# Patient Record
Sex: Male | Born: 1950 | Race: Black or African American | Hispanic: No | Marital: Single | Smoking: Current every day smoker
Health system: Southern US, Community
[De-identification: ages and names within clinical notes are randomized; demographics above are authoritative.]

## PROBLEM LIST (undated history)

## (undated) DIAGNOSIS — G510 Bell's palsy: Secondary | ICD-10-CM

## (undated) DIAGNOSIS — I10 Essential (primary) hypertension: Secondary | ICD-10-CM

---

## 2017-09-27 ENCOUNTER — Emergency Department: Payer: No Typology Code available for payment source

## 2017-09-27 ENCOUNTER — Other Ambulatory Visit: Payer: Self-pay

## 2017-09-27 ENCOUNTER — Inpatient Hospital Stay
Admission: EM | Admit: 2017-09-27 | Discharge: 2017-10-04 | DRG: 871 | Disposition: E | Payer: No Typology Code available for payment source | Attending: Internal Medicine | Admitting: Internal Medicine

## 2017-09-27 DIAGNOSIS — I341 Nonrheumatic mitral (valve) prolapse: Secondary | ICD-10-CM | POA: Diagnosis present

## 2017-09-27 DIAGNOSIS — J189 Pneumonia, unspecified organism: Secondary | ICD-10-CM | POA: Diagnosis not present

## 2017-09-27 DIAGNOSIS — Z7189 Other specified counseling: Secondary | ICD-10-CM | POA: Diagnosis not present

## 2017-09-27 DIAGNOSIS — R29810 Facial weakness: Secondary | ICD-10-CM | POA: Diagnosis present

## 2017-09-27 DIAGNOSIS — J690 Pneumonitis due to inhalation of food and vomit: Secondary | ICD-10-CM | POA: Diagnosis present

## 2017-09-27 DIAGNOSIS — R0902 Hypoxemia: Secondary | ICD-10-CM

## 2017-09-27 DIAGNOSIS — A419 Sepsis, unspecified organism: Secondary | ICD-10-CM | POA: Diagnosis present

## 2017-09-27 DIAGNOSIS — J9690 Respiratory failure, unspecified, unspecified whether with hypoxia or hypercapnia: Secondary | ICD-10-CM

## 2017-09-27 DIAGNOSIS — E43 Unspecified severe protein-calorie malnutrition: Secondary | ICD-10-CM

## 2017-09-27 DIAGNOSIS — R000 Tachycardia, unspecified: Secondary | ICD-10-CM | POA: Diagnosis present

## 2017-09-27 DIAGNOSIS — R059 Cough, unspecified: Secondary | ICD-10-CM

## 2017-09-27 DIAGNOSIS — J9622 Acute and chronic respiratory failure with hypercapnia: Secondary | ICD-10-CM | POA: Diagnosis present

## 2017-09-27 DIAGNOSIS — Z79899 Other long term (current) drug therapy: Secondary | ICD-10-CM

## 2017-09-27 DIAGNOSIS — E860 Dehydration: Secondary | ICD-10-CM | POA: Diagnosis present

## 2017-09-27 DIAGNOSIS — Z66 Do not resuscitate: Secondary | ICD-10-CM | POA: Diagnosis present

## 2017-09-27 DIAGNOSIS — F17210 Nicotine dependence, cigarettes, uncomplicated: Secondary | ICD-10-CM | POA: Diagnosis present

## 2017-09-27 DIAGNOSIS — I248 Other forms of acute ischemic heart disease: Secondary | ICD-10-CM | POA: Diagnosis present

## 2017-09-27 DIAGNOSIS — Z681 Body mass index (BMI) 19 or less, adult: Secondary | ICD-10-CM

## 2017-09-27 DIAGNOSIS — J439 Emphysema, unspecified: Secondary | ICD-10-CM | POA: Diagnosis present

## 2017-09-27 DIAGNOSIS — R0602 Shortness of breath: Secondary | ICD-10-CM | POA: Diagnosis present

## 2017-09-27 DIAGNOSIS — I2510 Atherosclerotic heart disease of native coronary artery without angina pectoris: Secondary | ICD-10-CM | POA: Diagnosis present

## 2017-09-27 DIAGNOSIS — I2723 Pulmonary hypertension due to lung diseases and hypoxia: Secondary | ICD-10-CM | POA: Diagnosis present

## 2017-09-27 DIAGNOSIS — I10 Essential (primary) hypertension: Secondary | ICD-10-CM | POA: Diagnosis present

## 2017-09-27 DIAGNOSIS — J850 Gangrene and necrosis of lung: Secondary | ICD-10-CM | POA: Diagnosis present

## 2017-09-27 DIAGNOSIS — I493 Ventricular premature depolarization: Secondary | ICD-10-CM | POA: Diagnosis present

## 2017-09-27 DIAGNOSIS — J9621 Acute and chronic respiratory failure with hypoxia: Secondary | ICD-10-CM | POA: Diagnosis present

## 2017-09-27 DIAGNOSIS — Z23 Encounter for immunization: Secondary | ICD-10-CM

## 2017-09-27 DIAGNOSIS — Z515 Encounter for palliative care: Secondary | ICD-10-CM

## 2017-09-27 DIAGNOSIS — Z886 Allergy status to analgesic agent status: Secondary | ICD-10-CM

## 2017-09-27 DIAGNOSIS — J9602 Acute respiratory failure with hypercapnia: Secondary | ICD-10-CM | POA: Diagnosis not present

## 2017-09-27 DIAGNOSIS — F1020 Alcohol dependence, uncomplicated: Secondary | ICD-10-CM | POA: Diagnosis present

## 2017-09-27 DIAGNOSIS — R1312 Dysphagia, oropharyngeal phase: Secondary | ICD-10-CM | POA: Diagnosis present

## 2017-09-27 DIAGNOSIS — D649 Anemia, unspecified: Secondary | ICD-10-CM | POA: Diagnosis present

## 2017-09-27 DIAGNOSIS — E876 Hypokalemia: Secondary | ICD-10-CM | POA: Diagnosis present

## 2017-09-27 DIAGNOSIS — R05 Cough: Secondary | ICD-10-CM

## 2017-09-27 DIAGNOSIS — J9601 Acute respiratory failure with hypoxia: Secondary | ICD-10-CM | POA: Diagnosis not present

## 2017-09-27 HISTORY — DX: Essential (primary) hypertension: I10

## 2017-09-27 HISTORY — DX: Bell's palsy: G51.0

## 2017-09-27 LAB — CBC WITH DIFFERENTIAL/PLATELET
BASOS PCT: 0 %
Basophils Absolute: 0 10*3/uL (ref 0–0.1)
Eosinophils Absolute: 0 10*3/uL (ref 0–0.7)
Eosinophils Relative: 0 %
HEMATOCRIT: 35.3 % — AB (ref 40.0–52.0)
Hemoglobin: 11.6 g/dL — ABNORMAL LOW (ref 13.0–18.0)
LYMPHS PCT: 5 %
Lymphs Abs: 1.1 10*3/uL (ref 1.0–3.6)
MCH: 29.9 pg (ref 26.0–34.0)
MCHC: 32.9 g/dL (ref 32.0–36.0)
MCV: 90.7 fL (ref 80.0–100.0)
Monocytes Absolute: 0.2 10*3/uL (ref 0.2–1.0)
Monocytes Relative: 1 %
NEUTROS PCT: 94 %
Neutro Abs: 20.8 10*3/uL — ABNORMAL HIGH (ref 1.4–6.5)
PLATELETS: 380 10*3/uL (ref 150–440)
RBC: 3.89 MIL/uL — ABNORMAL LOW (ref 4.40–5.90)
RDW: 12.8 % (ref 11.5–14.5)
WBC: 22.1 10*3/uL — ABNORMAL HIGH (ref 3.8–10.6)

## 2017-09-27 LAB — COMPREHENSIVE METABOLIC PANEL
ALT: 19 U/L (ref 17–63)
AST: 41 U/L (ref 15–41)
Albumin: 1.6 g/dL — ABNORMAL LOW (ref 3.5–5.0)
Alkaline Phosphatase: 167 U/L — ABNORMAL HIGH (ref 38–126)
Anion gap: 19 — ABNORMAL HIGH (ref 5–15)
BILIRUBIN TOTAL: 0.6 mg/dL (ref 0.3–1.2)
BUN: 61 mg/dL — AB (ref 6–20)
CHLORIDE: 91 mmol/L — AB (ref 101–111)
CO2: 28 mmol/L (ref 22–32)
CREATININE: 1.22 mg/dL (ref 0.61–1.24)
Calcium: 8.1 mg/dL — ABNORMAL LOW (ref 8.9–10.3)
GFR calc Af Amer: >60 mL/min (ref >60)
Glucose, Bld: 111 mg/dL — ABNORMAL HIGH (ref 65–99)
Potassium: 2.8 mmol/L — ABNORMAL LOW (ref 3.5–5.1)
Sodium: 138 mmol/L (ref 135–145)
Total Protein: 6.4 g/dL — ABNORMAL LOW (ref 6.5–8.1)

## 2017-09-27 LAB — INFLUENZA PANEL BY PCR (TYPE A & B)
INFLAPCR: NEGATIVE
INFLBPCR: NEGATIVE

## 2017-09-27 LAB — URINALYSIS, COMPLETE (UACMP) WITH MICROSCOPIC
BACTERIA UA: NONE SEEN
BILIRUBIN URINE: NEGATIVE
Glucose, UA: NEGATIVE mg/dL
Hgb urine dipstick: NEGATIVE
Ketones, ur: NEGATIVE mg/dL
LEUKOCYTES UA: NEGATIVE
Nitrite: NEGATIVE
PROTEIN: NEGATIVE mg/dL
SPECIFIC GRAVITY, URINE: 1.028 (ref 1.005–1.030)
Squamous Epithelial / LPF: NONE SEEN
pH: 5 (ref 5.0–8.0)

## 2017-09-27 LAB — LACTIC ACID, PLASMA
Lactic Acid, Venous: 3.2 mmol/L (ref 0.5–1.9)
Lactic Acid, Venous: 3.7 mmol/L (ref 0.5–1.9)

## 2017-09-27 LAB — TROPONIN I
TROPONIN I: 0.07 ng/mL — AB (ref <0.03)
Troponin I: 1.32 ng/mL (ref <0.03)

## 2017-09-27 LAB — CBC
HEMATOCRIT: 30.6 % — AB (ref 40.0–52.0)
HEMOGLOBIN: 10 g/dL — AB (ref 13.0–18.0)
MCH: 29.7 pg (ref 26.0–34.0)
MCHC: 32.8 g/dL (ref 32.0–36.0)
MCV: 90.4 fL (ref 80.0–100.0)
Platelets: 350 10*3/uL (ref 150–440)
RBC: 3.38 MIL/uL — ABNORMAL LOW (ref 4.40–5.90)
RDW: 12.6 % (ref 11.5–14.5)
WBC: 18.5 10*3/uL — ABNORMAL HIGH (ref 3.8–10.6)

## 2017-09-27 LAB — TSH: TSH: 0.434 u[IU]/mL (ref 0.350–4.500)

## 2017-09-27 LAB — BRAIN NATRIURETIC PEPTIDE: B Natriuretic Peptide: 176 pg/mL — ABNORMAL HIGH (ref 0.0–100.0)

## 2017-09-27 LAB — FIBRIN DERIVATIVES D-DIMER (ARMC ONLY): Fibrin derivatives D-dimer (ARMC): 1451.29 ng/mL (FEU) — ABNORMAL HIGH (ref 0.00–499.00)

## 2017-09-27 MED ORDER — SODIUM CHLORIDE 0.9 % IV SOLN: 1.0000 g | INTRAVENOUS | Status: DC

## 2017-09-27 MED ORDER — SODIUM CHLORIDE 0.9 % IV SOLN
INTRAVENOUS | Status: AC
  Administered 2017-09-27 – 2017-09-28 (×2): via INTRAVENOUS

## 2017-09-27 MED ORDER — SODIUM CHLORIDE 0.9 % IV SOLN
3.0000 g | Freq: Four times a day (QID) | INTRAVENOUS | Status: DC
  Administered 2017-09-27 – 2017-10-01 (×13): 3 g via INTRAVENOUS
  Filled 2017-09-27 (×19): qty 3

## 2017-09-27 MED ORDER — SODIUM CHLORIDE 0.9 % IV SOLN: 500.0000 mg | INTRAVENOUS | Status: DC

## 2017-09-27 MED ORDER — AZITHROMYCIN 500 MG IV SOLR
500.0000 mg | Freq: Once | INTRAVENOUS | Status: AC
  Administered 2017-09-27: 500 mg via INTRAVENOUS
  Filled 2017-09-27: qty 500

## 2017-09-27 MED ORDER — INFLUENZA VAC SPLIT HIGH-DOSE 0.5 ML IM SUSY
0.5000 mL | PREFILLED_SYRINGE | INTRAMUSCULAR | Status: AC
  Administered 2017-09-28: 10:00:00 0.5 mL via INTRAMUSCULAR
  Filled 2017-09-27: qty 0.5

## 2017-09-27 MED ORDER — IOPAMIDOL (ISOVUE-300) INJECTION 61%
75.0000 mL | Freq: Once | INTRAVENOUS | Status: AC | PRN
  Administered 2017-09-27: 75 mL via INTRAVENOUS

## 2017-09-27 MED ORDER — SODIUM CHLORIDE 0.9 % IV BOLUS
1000.0000 mL | Freq: Once | INTRAVENOUS | Status: AC
  Administered 2017-09-27: 1000 mL via INTRAVENOUS

## 2017-09-27 MED ORDER — AMLODIPINE BESYLATE 10 MG PO TABS
10.0000 mg | ORAL_TABLET | Freq: Every day | ORAL | Status: DC
  Administered 2017-09-28: 10 mg via ORAL
  Filled 2017-09-27: qty 2

## 2017-09-27 MED ORDER — METOPROLOL SUCCINATE ER 50 MG PO TB24
25.0000 mg | ORAL_TABLET | Freq: Every day | ORAL | Status: DC
Start: 2017-09-28 — End: 2017-09-28
  Administered 2017-09-28: 25 mg via ORAL
  Filled 2017-09-27 (×2): qty 1

## 2017-09-27 MED ORDER — POTASSIUM CHLORIDE 10 MEQ/100ML IV SOLN
10.0000 meq | Freq: Once | INTRAVENOUS | Status: AC
  Administered 2017-09-27: 17:00:00 10 meq via INTRAVENOUS
  Filled 2017-09-27: qty 100

## 2017-09-27 MED ORDER — ENOXAPARIN SODIUM 40 MG/0.4ML ~~LOC~~ SOLN
40.0000 mg | SUBCUTANEOUS | Status: DC
  Administered 2017-09-27 – 2017-09-30 (×3): 40 mg via SUBCUTANEOUS
  Filled 2017-09-27 (×3): qty 0.4

## 2017-09-27 MED ORDER — ONDANSETRON HCL 4 MG/2ML IJ SOLN
4.0000 mg | Freq: Four times a day (QID) | INTRAMUSCULAR | Status: DC | PRN
  Administered 2017-09-29: 4 mg via INTRAVENOUS
  Filled 2017-09-27: qty 2

## 2017-09-27 MED ORDER — SODIUM CHLORIDE 0.9 % IV SOLN
1.0000 g | Freq: Once | INTRAVENOUS | Status: AC
  Administered 2017-09-27: 1 g via INTRAVENOUS
  Filled 2017-09-27: qty 10

## 2017-09-27 MED ORDER — ACETAMINOPHEN 325 MG PO TABS: 650.0000 mg | ORAL_TABLET | Freq: Four times a day (QID) | ORAL | Status: DC | PRN

## 2017-09-27 MED ORDER — TRAZODONE HCL 50 MG PO TABS
50.0000 mg | ORAL_TABLET | Freq: Every evening | ORAL | Status: DC | PRN
  Administered 2017-09-27: 50 mg via ORAL
  Filled 2017-09-27: qty 1

## 2017-09-27 MED ORDER — POTASSIUM CHLORIDE CRYS ER 20 MEQ PO TBCR
40.0000 meq | EXTENDED_RELEASE_TABLET | ORAL | Status: AC
  Administered 2017-09-27 (×2): 40 meq via ORAL
  Filled 2017-09-27 (×2): qty 2

## 2017-09-27 MED ORDER — PNEUMOCOCCAL VAC POLYVALENT 25 MCG/0.5ML IJ INJ
0.5000 mL | INJECTION | INTRAMUSCULAR | Status: AC
  Administered 2017-09-28: 10:00:00 0.5 mL via INTRAMUSCULAR
  Filled 2017-09-27: qty 0.5

## 2017-09-27 MED ORDER — LOSARTAN POTASSIUM 50 MG PO TABS
100.0000 mg | ORAL_TABLET | Freq: Every day | ORAL | Status: DC
  Administered 2017-09-28: 10:00:00 100 mg via ORAL
  Filled 2017-09-27: qty 2

## 2017-09-27 MED ORDER — NICOTINE 21 MG/24HR TD PT24
21.0000 mg | MEDICATED_PATCH | Freq: Every day | TRANSDERMAL | Status: DC
  Administered 2017-09-27 – 2017-09-30 (×4): 21 mg via TRANSDERMAL
  Filled 2017-09-27 (×4): qty 1

## 2017-09-27 NOTE — ED Notes (Signed)
Pt was taken to CT and when he was returned this nurse was not notified and they did not restart his atb or let the nurse know that it needed to be done - IV zithromax being transferred to the floor with pt

## 2017-09-27 NOTE — Progress Notes (Signed)
Pharmacy Antibiotic Note  Kyle Bates is a 67 y.o. male admitted on 10/01/2017 with pneumonia.  Pharmacy has been consulted for aspiration pna unasyn dosing.  Plan: unasyn 3gm iv q6h  Height: _0  (167.6 cm) Weight: 135 lb (61.2 kg) IBW/kg (Calculated) : 63.8  Temp (24hrs), Avg:97.6 F (36.4 C), Min:97.6 F (36.4 C), Max:97.6 F (36.4 C)  Recent Labs  Lab 09/24/2017 1152 09/03/2017 1410  WBC 22.1*  --   CREATININE 1.22  --   LATICACIDVEN  --  3.2*    Estimated Creatinine Clearance: 52.3 mL/min (by C-G formula based on SCr of 1.22 mg/dL).    Allergies  Allergen Reactions  . Aspirin     Upset stomach. Gi bleed    Antimicrobials this admission: Anti-infectives (From admission, onward)   Start     Dose/Rate Route Frequency Ordered Stop   09/28/17 1400  cefTRIAXone (ROCEPHIN) 1 g in sodium chloride 0.9 % 100 mL IVPB  Status:  Discontinued     1 g 200 mL/hr over 30 Minutes Intravenous Every 24 hours 09/24/2017 1534 09/18/2017 1612   09/24/2017 1630  Ampicillin-Sulbactam (UNASYN) 3 g in sodium chloride 0.9 % 100 mL IVPB     3 g 200 mL/hr over 30 Minutes Intravenous Every 6 hours 09/19/2017 1623     09/24/2017 1545  azithromycin (ZITHROMAX) 500 mg in sodium chloride 0.9 % 250 mL IVPB  Status:  Discontinued     500 mg 250 mL/hr over 60 Minutes Intravenous Every 24 hours 09/29/2017 1532 10/02/2017 1612   09/11/2017 1345  cefTRIAXone (ROCEPHIN) 1 g in sodium chloride 0.9 % 100 mL IVPB     1 g 200 mL/hr over 30 Minutes Intravenous  Once 10/02/2017 1335 09/13/2017 1523   09/19/2017 1345  azithromycin (ZITHROMAX) 500 mg in sodium chloride 0.9 % 250 mL IVPB     500 mg 250 mL/hr over 60 Minutes Intravenous  Once 09/19/2017 1335 09/19/2017 1552      Microbiology results: No results found for this or any previous visit (from the past 240 hour(s)).   Thank you for allowing pharmacy to be a part of this patient's care.  Donna Christen Zuriyah Shatz 09/28/2017 4:23 PM

## 2017-09-27 NOTE — ED Notes (Signed)
Dr Cherylann Banas notified of elevated troponin of 0.07 - no new orders at this time

## 2017-09-27 NOTE — H&P (Signed)
[facility] [place] NAME: Kyle Bates    MR#:  [id number]  DATE OF BIRTH:  1951-06-18  DATE OF ADMISSION:  09/14/2017  PRIMARY CARE PHYSICIAN: Patient, No Pcp Per   REQUESTING/REFERRING PHYSICIAN: Dr. Arta Silence  CHIEF COMPLAINT:   Chief Complaint  Patient presents with  . Shortness of Breath    HISTORY OF PRESENT ILLNESS:  Kyle Bates  is a 67 y.o. male with a known history of left facial Bell's palsy, hypertension, ongoing smoking and alcohol use presents to hospital secondary to worsening weakness, difficulty breathing going on for several weeks now. Patient is a very poor historian. He smokes about 1 pack of cigarettes every day and has been drinking at least 3-5 cans of beer every night. He continues to work but has been noticing that he has become very weak. His appetite is low and has lost several pounds of weight in the last couple of months. The shortness of breath has been worsening in the last week, also complains of some low-grade fevers and chills. Denies any chest pain, nausea or vomiting. Chest x-ray here reveals right middle lobe and lower lobe infiltrates.  PAST MEDICAL HISTORY:   Past Medical History:  Diagnosis Date  . Bell's palsy    left face  . Hypertension     PAST SURGICAL HISTORY:  History reviewed. No pertinent surgical history.  SOCIAL HISTORY:   Social History   Tobacco Use  . Smoking status: Current Every Day Smoker    Packs/day: 1.00    Types: Cigarettes  . Smokeless tobacco: Never Used  Substance Use Topics  . Alcohol use: Yes    Alcohol/week: 1.2 oz    Types: 2 Cans of beer per week    Comment: 3 cans per day    FAMILY HISTORY:   Family History  Problem Relation Age of Onset  . Dementia Mother     DRUG ALLERGIES:   Allergies  Allergen Reactions  . Aspirin     Upset stomach. Gi bleed    REVIEW OF SYSTEMS:   Review of Systems  Constitutional: Positive for  malaise/fatigue and weight loss. Negative for chills and fever.  HENT: Negative for ear discharge, ear pain, hearing loss and nosebleeds.   Eyes: Negative for blurred vision, double vision and photophobia.  Respiratory: Positive for cough and shortness of breath. Negative for hemoptysis and wheezing.   Cardiovascular: Positive for orthopnea. Negative for chest pain, palpitations and leg swelling.  Gastrointestinal: Negative for abdominal pain, constipation, diarrhea, melena, nausea and vomiting.  Genitourinary: Negative for dysuria, frequency and urgency.  Musculoskeletal: Positive for back pain. Negative for myalgias and neck pain.  Skin: Negative for rash.  Neurological: Negative for dizziness, tingling, sensory change, speech change, focal weakness and headaches.  Endo/Heme/Allergies: Does not bruise/bleed easily.  Psychiatric/Behavioral: Negative for depression.    MEDICATIONS AT HOME:   Prior to Admission medications   Medication Sig Start Date End Date Taking? Authorizing Provider  amLODipine (NORVASC) 10 MG tablet Take 10 mg by mouth daily.   Yes [provider]  losartan-hydrochlorothiazide (HYZAAR) 100-25 MG tablet Take 1 tablet by mouth daily.   Yes [provider]  metoprolol succinate (TOPROL-XL) 25 MG 24 hr tablet Take 25 mg by mouth daily.   Yes [provider]      VITAL SIGNS:  Blood pressure 131/69, pulse 100, temperature 97.6 F (36.4 C), temperature source Oral, resp. rate (!) 35, height _0  (1.676  m), weight 61.2 kg (135 lb), SpO2 95 %.  PHYSICAL EXAMINATION:   Physical Exam  GENERAL:  67 y.o.-year-old ill nourished patient lying in the bed with no acute distress.  EYES: Pupils equal, round, reactive to light and accommodation. No scleral icterus. Extraocular muscles intact.  HEENT: Left facial droop due to bells palsy. Head atraumatic, normocephalic. Oropharynx and nasopharynx clear.  NECK:  Supple, no jugular venous distention. No  thyroid enlargement, no tenderness.  LUNGS: Normal breath sounds bilaterally, no wheezing, rales,rhonchi or crepitation. No use of accessory muscles of respiration. Decreased right basilar breath sounds CARDIOVASCULAR: S1, S2 normal. No murmurs, rubs, or gallops.  ABDOMEN: Soft, nontender, nondistended. Bowel sounds present. No organomegaly or mass.  EXTREMITIES: No pedal edema, cyanosis, or clubbing.  NEUROLOGIC: Cranial nerves II through XII are intact. Muscle strength 5/5 in all extremities. Sensation intact. Gait not checked. Global weakness noted. PSYCHIATRIC: The patient is alert and oriented x 3.  SKIN: No obvious rash, lesion, or ulcer.   LABORATORY PANEL:   CBC Recent Labs  Lab 09/04/2017 1152  WBC 22.1*  HGB 11.6*  HCT 35.3*  PLT 380   ------------------------------------------------------------------------------------------------------------------  Chemistries  Recent Labs  Lab 09/18/2017 1152  NA 138  K 2.8*  CL 91*  CO2 28  GLUCOSE 111*  BUN 61*  CREATININE 1.22  CALCIUM 8.1*  AST 41  ALT 19  ALKPHOS 167*  BILITOT 0.6   ------------------------------------------------------------------------------------------------------------------  Cardiac Enzymes Recent Labs  Lab 09/26/2017 1152  TROPONINI 0.07*   ------------------------------------------------------------------------------------------------------------------  RADIOLOGY:  Dg Chest 2 View  Result Date: 09/12/2017 CLINICAL DATA:  Shortness of breath and hypoxia EXAM: CHEST - 2 VIEW COMPARISON:  None. FINDINGS: Cardiac shadow is within normal limits. Left lung demonstrates some mild interstitial changes. Diffuse consolidation is noted in the right upper lobe along the minor fissure. Patchy infiltrative changes are identified throughout the right lung. No sizable effusion is noted. No bony abnormality is seen. IMPRESSION: Multifocal infiltrates worse in the right upper lobe. Followup PA and lateral chest  X-ray is recommended in 3-4 weeks following trial of antibiotic therapy to ensure resolution and exclude underlying malignancy. Electronically Signed   By: Inez Catalina M.D.   On: 10/01/2017 13:20    EKG:   Orders placed or performed during the hospital encounter of 09/24/2017  . ED EKG  . ED EKG  . EKG 12-Lead  . EKG 12-Lead    IMPRESSION AND PLAN:   Kyle Bates  is a 67 y.o. male with a known history of left facial Bell's palsy, hypertension, ongoing smoking and alcohol use presents to hospital secondary to worsening weakness, difficulty breathing going on for several weeks now.  1.Sepsis-secondary to right upper lobe pneumonia. -Due to his significant alcohol use, cannot rule out aspiration because of the location of pneumonia. -We'll start Unasyn. Follow-up blood cultures.  2. Hypokalemia-being replaced Hold HCTZ  3. Weight loss and anorexia- CT chest to r/o underlying lung mass Will need age appropriate malignancy work up- may be as outpatient- never had a colonoscopy Dietitian consult  4. Smoking-counseled, nicotine patch  5. elevated troponin-demand ischemia from sepsis and hypoxia- monitor Cardiac monitoring  6. DVT prophylaxis- lovenox  7. HTN- continue losartan, norvasc and metoprolol Hold HCTZ  Physical Therapy consulted.    All the records are reviewed and case discussed with ED provider. Management plans discussed with the patient, family and they are in agreement.  CODE STATUS: Full Code  TOTAL TIME TAKING CARE OF THIS PATIENT: 42  minutes.    Gladstone Lighter M.D on 09/30/2017 at 4:04 PM  Between 7am to 6pm - Pager - [phone]  After 6pm go to [email or web address] - password [email or web address] [facility]  Office  [phone]  CC: Primary care physician; Patient, No Pcp Per

## 2017-09-27 NOTE — Progress Notes (Signed)
Pharmacy Antibiotic Note  Kyle Bates is a 67 y.o. male admitted on 09/10/2017 with pneumonia.  Pharmacy has been consulted for ceftriaxone dosing.  Plan: ceftriaxone 1gm iv q24h   Height: _0  (167.6 cm) Weight: 135 lb (61.2 kg) IBW/kg (Calculated) : 63.8  Temp (24hrs), Avg:97.6 F (36.4 C), Min:97.6 F (36.4 C), Max:97.6 F (36.4 C)  Recent Labs  Lab 09/11/2017 1152 09/10/2017 1410  WBC 22.1*  --   CREATININE 1.22  --   LATICACIDVEN  --  3.2*    Estimated Creatinine Clearance: 52.3 mL/min (by C-G formula based on SCr of 1.22 mg/dL).    Allergies  Allergen Reactions  . Aspirin     Upset stomach. Gi bleed    Antimicrobials this admission: Anti-infectives (From admission, onward)   Start     Dose/Rate Route Frequency Ordered Stop   09/28/17 1400  cefTRIAXone (ROCEPHIN) 1 g in sodium chloride 0.9 % 100 mL IVPB     1 g 200 mL/hr over 30 Minutes Intravenous Every 24 hours 09/17/2017 1534     09/10/2017 1545  azithromycin (ZITHROMAX) 500 mg in sodium chloride 0.9 % 250 mL IVPB     500 mg 250 mL/hr over 60 Minutes Intravenous Every 24 hours 09/25/2017 1532     09/13/2017 1345  cefTRIAXone (ROCEPHIN) 1 g in sodium chloride 0.9 % 100 mL IVPB     1 g 200 mL/hr over 30 Minutes Intravenous  Once 09/12/2017 1335 09/08/2017 1523   09/16/2017 1345  azithromycin (ZITHROMAX) 500 mg in sodium chloride 0.9 % 250 mL IVPB     500 mg 250 mL/hr over 60 Minutes Intravenous  Once 09/28/2017 1335         Microbiology results: No results found for this or any previous visit (from the past 240 hour(s)).   Thank you for allowing pharmacy to be a part of this patient's care.  Donna Christen Labrenda Lasky 09/05/2017 3:34 PM

## 2017-09-27 NOTE — ED Provider Notes (Signed)
[facility] Emergency Department Provider Note ____________________________________________   First MD Initiated Contact with Patient 09/12/2017 1153     (approximate)  I have reviewed the triage vital signs and the nursing notes.   HISTORY  Chief Complaint Shortness of Breath    HPI Kyle Bates is a 67 y.o. male with past medical history of hypertension who presents primarily with shortness of breath, gradual onset over approximately the last month, worsening in the last several days to a week, and associated with an unintentional 30 pound weight loss over the last few months.  The patient also reports generalized weakness and fatigue.  He denies any vomiting, diarrhea, urinary symptoms, chest pain, or fever.  He states that he is compliant with his blood pressure medications.  No prior history of this shortness of breath.   Past Medical History:  Diagnosis Date  . Hypertension     There are no active problems to display for this patient.   History reviewed. No pertinent surgical history.  Prior to Admission medications   Medication Sig Start Date End Date Taking? Authorizing Provider  amLODipine (NORVASC) 10 MG tablet Take 10 mg by mouth daily.   Yes [provider]  losartan-hydrochlorothiazide (HYZAAR) 100-25 MG tablet Take 1 tablet by mouth daily.   Yes [provider]  metoprolol succinate (TOPROL-XL) 25 MG 24 hr tablet Take 25 mg by mouth daily.   Yes [provider]    Allergies Aspirin  No family history on file.  Social History Social History   Tobacco Use  . Smoking status: Current Every Day Smoker    Packs/day: 1.00    Types: Cigarettes  . Smokeless tobacco: Never Used  Substance Use Topics  . Alcohol use: Yes    Alcohol/week: 1.2 oz    Types: 2 Cans of beer per week  . Drug use: Never    Review of Systems  Constitutional: No fever. Eyes: No redness. ENT: No sore throat. Cardiovascular:  Denies chest pain. Respiratory: Positive for shortness of breath. Gastrointestinal: No diarrhea.  Genitourinary: Negative for dysuria.  Musculoskeletal: Negative for back pain. Skin: Negative for rash. Neurological: Negative for headache.   ____________________________________________   PHYSICAL EXAM:  VITAL SIGNS: ED Triage Vitals  Enc Vitals Group     BP 09/06/2017 1135 114/73     Pulse Rate 09/25/2017 1135 (!) 119     Resp 09/22/2017 1135 (!) 35     Temp 09/23/2017 1135 97.6 F (36.4 C)     Temp Source 09/21/2017 1135 Oral     SpO2 09/15/2017 1131 95 %     Weight 09/10/2017 1135 135 lb (61.2 kg)     Height 09/24/2017 1135 _0  (1.676 m)     Head Circumference --      Peak Flow --      Pain Score 09/24/2017 1135 0     Pain Loc --      Pain Edu? --      Excl. in [place]? --     Constitutional: Alert and oriented.  Somewhat cachectic appearing.  No acute distress. Eyes: Conjunctivae are normal.  EOMI. Head: Atraumatic. Nose: No congestion/rhinnorhea. Mouth/Throat: Mucous membranes are somewhat dry.   Neck: Normal range of motion.  Cardiovascular: Tachycardic, regular rhythm. Grossly normal heart sounds.  Good peripheral circulation. Respiratory: Normal respiratory effort.  No retractions.  Slightly decreased breath sounds bilaterally with some faint rales to bases. Gastrointestinal: Soft and nontender. No distention.  Genitourinary: No flank tenderness. Musculoskeletal:  No lower extremity edema.  Extremities warm and well perfused.  Neurologic:  Normal speech and language. No gross focal neurologic deficits are appreciated.  Skin:  Skin is warm and dry. No rash noted. Psychiatric: Mood and affect are normal. Speech and behavior are normal.  ____________________________________________   LABS (all labs ordered are listed, but only abnormal results are displayed)  Labs Reviewed  CBC WITH DIFFERENTIAL/PLATELET - Abnormal; Notable for the following components:      Result Value   WBC  22.1 (*)    RBC 3.89 (*)    Hemoglobin 11.6 (*)    HCT 35.3 (*)    Neutro Abs 20.8 (*)    All other components within normal limits  COMPREHENSIVE METABOLIC PANEL - Abnormal; Notable for the following components:   Potassium 2.8 (*)    Chloride 91 (*)    Glucose, Bld 111 (*)    BUN 61 (*)    Calcium 8.1 (*)    Total Protein 6.4 (*)    Albumin 1.6 (*)    Alkaline Phosphatase 167 (*)    Anion gap 19 (*)    All other components within normal limits  TROPONIN I - Abnormal; Notable for the following components:   Troponin I 0.07 (*)    All other components within normal limits  BRAIN NATRIURETIC PEPTIDE - Abnormal; Notable for the following components:   B Natriuretic Peptide 176.0 (*)    All other components within normal limits  FIBRIN DERIVATIVES D-DIMER ([facility] ONLY) - Abnormal; Notable for the following components:   Fibrin derivatives D-dimer ([facility]) 1,451.29 (*)    All other components within normal limits  CULTURE, BLOOD (ROUTINE X 2)  CULTURE, BLOOD (ROUTINE X 2)  INFLUENZA PANEL BY PCR (TYPE A & B)  URINALYSIS, COMPLETE (UACMP) WITH MICROSCOPIC  LACTIC ACID, PLASMA  LACTIC ACID, PLASMA   ____________________________________________  EKG  ED ECG REPORT I, Arta Silence, the attending physician, personally viewed and interpreted this ECG.  Date: 09/15/2017 EKG Time: 1130 Rate: 119 Rhythm: Sinus tachycardia with frequent PVCs QRS Axis: Borderline right axis Intervals: Borderline prolonged QT ST/T Wave abnormalities: normal Narrative Interpretation: no evidence of acute ischemia  ____________________________________________  RADIOLOGY  CXR: Multifocal right lung infiltrates  ____________________________________________   PROCEDURES  Procedure(s) performed: No  Procedures  Critical Care performed: Yes  CRITICAL CARE Performed by: Arta Silence   Total critical care time: 35 minutes  Critical care time was exclusive of separately billable  procedures and treating other patients.  Critical care was necessary to treat or prevent imminent or life-threatening deterioration.  Critical care was time spent personally by me on the following activities: development of treatment plan with patient and/or surrogate as well as nursing, discussions with consultants, evaluation of patient's response to treatment, examination of patient, obtaining history from patient or surrogate, ordering and performing treatments and interventions, ordering and review of laboratory studies, ordering and review of radiographic studies, pulse oximetry and re-evaluation of patient's condition.  ____________________________________________   INITIAL IMPRESSION / ASSESSMENT AND PLAN / ED COURSE  Pertinent labs & imaging results that were available during my care of the patient were reviewed by me and considered in my medical decision making (see chart for details).  67 year old male with past medical history of hypertension presents with shortness of breath for approximately 1 month, worsening recently, and associated with generalized weakness as well as unintentional weight loss.  The patient went to his primary care today, and was found to be hypoxic and tachycardic  so was referred to the emergency department.  Past medical records reviewed in Epic and are noncontributory.  On exam, the patient is tachycardic, and was hypoxic on room air although is in the mid 90s on 2 L by [place].  His lungs are mostly clear with some rales at the bases, he is cachectic and slightly dehydrated appearing, and the remainder the exam is as described above.  Differential is broad but includes new onset CHF, COPD, pneumonia, viral bronchitis, or less likely but possible PE given the hypoxia and tachycardia, or lung CA given the unintentional weight loss.  Plan: Chest x-ray, basic and cardiac labs, d-dimer, UA, fluids, and reassess.   ----------------------------------------- 2:19 PM  on 09/03/2017 -----------------------------------------  Chest x-ray reveals multifocal right lung infiltrates.  Lab workup including WBC of 22 is consistent with acute infection.  Although the patient's d-dimer is elevated, at this point there is no clinical evidence for PE given the markedly abnormal x-ray findings and the presence of infiltrates (I would further workup PE if the chest x-ray was negative).  Given the patient's weight loss as well as the appearance of the infiltrates on x-ray, we cannot rule out underlying malignancy, however this can be worked up with further imaging.  Given patient's hypoxia he will require admission.  ----------------------------------------- 2:32 PM on 09/14/2017 -----------------------------------------  I signed the patient out to the hospitalist Dr. Tressia Miners. ____________________________________________   FINAL CLINICAL IMPRESSION(S) / ED DIAGNOSES  Final diagnoses:  Community acquired pneumonia of right lung, unspecified part of lung  Hypoxia      NEW MEDICATIONS STARTED DURING THIS VISIT:  New Prescriptions   No medications on file     Note:  This document was prepared using Dragon voice recognition software and may include unintentional dictation errors.    Arta Silence, MD 09/05/2017 [phone]

## 2017-09-27 NOTE — ED Notes (Signed)
Pt refuses to be in and out cath'd - he states that he can void when he needs to - very pleasant just not wanting cath

## 2017-09-27 NOTE — ED Notes (Signed)
Attempted to collect urine sample and pt was unable to void - advised pt that if he could not void in the next 30 min that we would in and out cath him to obtain sample

## 2017-09-27 NOTE — ED Notes (Signed)
Pt is aware that urine needs to be collected - he will call for nurse when he has to void

## 2017-09-27 NOTE — ED Triage Notes (Signed)
Pt arrived via ems for c/o shortness of breath - pt was at the PCP and desat'd to 88% - ems was called - initial O2 sat 88% then placed on O2 and increased from 2l to 3l for sat of 95% - pt denies any pain or discomfort and denies any medical history other than HTN - pt states he has lost 30lbs since July with no reason

## 2017-09-27 NOTE — Progress Notes (Signed)
Lab notified me of pts lactic of 3.7, Dr Tressia Miners notified

## 2017-09-27 NOTE — ED Notes (Signed)
Dr Cherylann Banas notified of elevated lactic acid of 3.2 - vo given for nacl 1000 bolus after this bolus finishes

## 2017-09-27 NOTE — ED Notes (Signed)
Lab called and stated that blue top tube had hemolyzed - requested lab to come and draw sample as the IV he has will not pull back and he has been stuck x2

## 2017-09-28 ENCOUNTER — Inpatient Hospital Stay
Admit: 2017-09-28 | Discharge: 2017-09-28 | Disposition: A | Discharge status: Home / Self Care | Payer: No Typology Code available for payment source | Attending: Internal Medicine | Admitting: Internal Medicine

## 2017-09-28 DIAGNOSIS — E43 Unspecified severe protein-calorie malnutrition: Secondary | ICD-10-CM

## 2017-09-28 LAB — CBC WITH DIFFERENTIAL/PLATELET
BASOS ABS: 0 10*3/uL (ref 0–0.1)
BLASTS: 0 %
Band Neutrophils: 26 %
Basophils Relative: 0 %
Eosinophils Absolute: 0 10*3/uL (ref 0–0.7)
Eosinophils Relative: 0 %
HCT: 26.9 % — ABNORMAL LOW (ref 40.0–52.0)
Hemoglobin: 9.1 g/dL — ABNORMAL LOW (ref 13.0–18.0)
LYMPHS ABS: 1.2 10*3/uL (ref 1.0–3.6)
Lymphocytes Relative: 5 %
MCH: 30.1 pg (ref 26.0–34.0)
MCHC: 33.7 g/dL (ref 32.0–36.0)
MCV: 89.4 fL (ref 80.0–100.0)
METAMYELOCYTES PCT: 0 %
MONO ABS: 0.2 10*3/uL (ref 0.2–1.0)
MYELOCYTES: 0 %
Monocytes Relative: 1 %
NEUTROS ABS: 23.2 10*3/uL — AB (ref 1.4–6.5)
Neutrophils Relative %: 68 %
Other: 0 %
PLATELETS: 261 10*3/uL (ref 150–440)
Promyelocytes Absolute: 0 %
RBC: 3.01 MIL/uL — ABNORMAL LOW (ref 4.40–5.90)
RDW: 12.8 % (ref 11.5–14.5)
Smear Review: ADEQUATE
WBC: 24.6 10*3/uL — AB (ref 3.8–10.6)
nRBC: 2 /100 WBC — ABNORMAL HIGH

## 2017-09-28 LAB — TROPONIN I: Troponin I: 0.15 ng/mL (ref <0.03)

## 2017-09-28 LAB — MAGNESIUM: MAGNESIUM: 2.4 mg/dL (ref 1.7–2.4)

## 2017-09-28 LAB — COMPREHENSIVE METABOLIC PANEL
ALK PHOS: 135 U/L — AB (ref 38–126)
ALT: 25 U/L (ref 17–63)
AST: 42 U/L — ABNORMAL HIGH (ref 15–41)
Albumin: 1.3 g/dL — ABNORMAL LOW (ref 3.5–5.0)
Anion gap: 11 (ref 5–15)
BILIRUBIN TOTAL: 0.6 mg/dL (ref 0.3–1.2)
BUN: 34 mg/dL — AB (ref 6–20)
CALCIUM: 7.5 mg/dL — AB (ref 8.9–10.3)
CO2: 27 mmol/L (ref 22–32)
CREATININE: 0.67 mg/dL (ref 0.61–1.24)
Chloride: 105 mmol/L (ref 101–111)
GFR calc Af Amer: >60 mL/min (ref >60)
Glucose, Bld: 57 mg/dL — ABNORMAL LOW (ref 65–99)
POTASSIUM: 3.1 mmol/L — AB (ref 3.5–5.1)
Sodium: 143 mmol/L (ref 135–145)
TOTAL PROTEIN: 5.2 g/dL — AB (ref 6.5–8.1)

## 2017-09-28 LAB — ECHOCARDIOGRAM COMPLETE
Height: 66 in
WEIGHTICAEL: 2160 [oz_av]

## 2017-09-28 LAB — CK: Total CK: 27 U/L — ABNORMAL LOW (ref 49–397)

## 2017-09-28 LAB — LACTIC ACID, PLASMA: LACTIC ACID, VENOUS: 2.2 mmol/L — AB (ref 0.5–1.9)

## 2017-09-28 MED ORDER — FOLIC ACID 1 MG PO TABS
1.0000 mg | ORAL_TABLET | Freq: Every day | ORAL | Status: DC
  Administered 2017-09-28: 1 mg via ORAL
  Filled 2017-09-28: qty 1

## 2017-09-28 MED ORDER — THIAMINE HCL 100 MG/ML IJ SOLN
100.0000 mg | Freq: Every day | INTRAMUSCULAR | Status: DC
  Filled 2017-09-28 (×2): qty 1

## 2017-09-28 MED ORDER — SODIUM CHLORIDE 0.9 % IV SOLN
500.0000 mg | INTRAVENOUS | Status: DC
  Administered 2017-09-29 – 2017-09-30 (×3): 500 mg via INTRAVENOUS
  Filled 2017-09-28 (×4): qty 500

## 2017-09-28 MED ORDER — LORAZEPAM 2 MG/ML IJ SOLN
2.0000 mg | Freq: Four times a day (QID) | INTRAMUSCULAR | Status: DC | PRN
  Administered 2017-09-28 – 2017-09-29 (×2): 2 mg via INTRAVENOUS
  Filled 2017-09-28 (×2): qty 1

## 2017-09-28 MED ORDER — ADULT MULTIVITAMIN W/MINERALS CH
1.0000 | ORAL_TABLET | Freq: Every day | ORAL | Status: DC
  Administered 2017-09-28: 17:00:00 1 via ORAL
  Filled 2017-09-28: qty 1

## 2017-09-28 MED ORDER — CHLORDIAZEPOXIDE HCL 10 MG PO CAPS
10.0000 mg | ORAL_CAPSULE | Freq: Three times a day (TID) | ORAL | Status: DC
  Administered 2017-09-28: 17:00:00 10 mg via ORAL
  Filled 2017-09-28: qty 2

## 2017-09-28 MED ORDER — VITAMIN B-1 100 MG PO TABS
100.0000 mg | ORAL_TABLET | Freq: Every day | ORAL | Status: DC
  Administered 2017-09-28: 100 mg via ORAL
  Filled 2017-09-28: qty 1

## 2017-09-28 MED ORDER — METOPROLOL TARTRATE 25 MG PO TABS: 25.0000 mg | ORAL_TABLET | Freq: Two times a day (BID) | ORAL | Status: DC

## 2017-09-28 MED ORDER — POTASSIUM CHLORIDE CRYS ER 20 MEQ PO TBCR: 40.0000 meq | EXTENDED_RELEASE_TABLET | ORAL | Status: DC

## 2017-09-28 MED ORDER — SODIUM CHLORIDE 0.9 % IV SOLN: INTRAVENOUS | Status: AC

## 2017-09-28 MED ORDER — LORAZEPAM 1 MG PO TABS
1.0000 mg | ORAL_TABLET | Freq: Four times a day (QID) | ORAL | Status: DC | PRN
  Administered 2017-09-28: 17:00:00 1 mg via ORAL
  Filled 2017-09-28: qty 1

## 2017-09-28 MED ORDER — POTASSIUM CHLORIDE 10 MEQ/100ML IV SOLN
10.0000 meq | INTRAVENOUS | Status: DC
Start: 2017-09-28 — End: 2017-09-28

## 2017-09-28 MED ORDER — LORAZEPAM 2 MG/ML IJ SOLN: 1.0000 mg | Freq: Four times a day (QID) | INTRAMUSCULAR | Status: DC | PRN

## 2017-09-28 MED ORDER — POTASSIUM CHLORIDE 10 MEQ/100ML IV SOLN
10.0000 meq | INTRAVENOUS | Status: AC
  Administered 2017-09-28 (×6): 10 meq via INTRAVENOUS
  Filled 2017-09-28 (×6): qty 100

## 2017-09-28 MED ORDER — SODIUM CHLORIDE 0.9 % IV BOLUS
1000.0000 mL | Freq: Once | INTRAVENOUS | Status: AC
  Administered 2017-09-28: 1000 mL via INTRAVENOUS

## 2017-09-28 NOTE — Consult Note (Signed)
[facility] Consultation      Assessment and Plan:  Severe cavitary necrotic pneumonia with sepsis. -Agree with Unasyn, will add azithromycin for atypical coverage. --Will check TB quantiferon.  --Recheck Ct chest outpatient after completing treatment, may require bronch, would need cardiac clearance before-hand.   Emphysema, seen on CT chest. --Anoro upon DC, nebs prn.   Nicotine Abuse.  --Discussed importance of cessation.   Possible NSTEMI mild increase in troponins. --Cardiology consulted.   Dementia? --Patient does not appear to understand questions, and does not answer appropriately, not sure if this represents dementia or disability, however in any case patient would need a family member to consent for potential bronchoscopy.     Date: 09/28/2017  MRN# [id number] Kyle Bates 07-13-1950  Referring Physician:   Amal Saiki is a 67 y.o. old male seen in consultation for chief complaint of:    Chief Complaint  Patient presents with  . Shortness of Breath    HPI:  Patient is a 67 year old male who presented to the ED by EMS with progressive shortness over the last month with increasing progressed over the last few days.  He is lost approximately 30 pounds unintentionally over the last few months.  He has a history of ongoing alcohol and tobacco abuse, with current use of about a pack of cigarettes per day and drink about 3-5 cans of beer every night.  He does not have any previous history other than Bell's palsy or hypertension, there are no previous encounters in our system.  Currently the patient can not provide any appreciable history, he replies "Yes sir" and "Awright" to most questions. He has trouble relaying his birth date and address. He says that he lives alone and still drives a car. He does not appear to have had any exposure to medical care.   Review of lab studies shows mildly elevated troponin initially of 1.32, since decreased to 0.15.   Venous lactic acid was elevated at 3.7, which has since come down to 2.2, the patient also had leukocytosis of 18.5, subsequently increased to 24.6, left shift and toxic granulation, influenza negative..  Above seems to suggest sepsis, complicated by NSTEMI.  Images personally reviewed, there is right paratracheal lymphadenopathy, subcarinal and right hilar lymphadenopathy. There is severe multi-lobar pneumonia necrotic right pneumonia, there are scattered infiltrates in the left lung as well, an area which in the lingula appears cavitary.   PMHX:   Past Medical History:  Diagnosis Date  . Bell's palsy    left face  . Hypertension    Surgical Hx:  History reviewed. No pertinent surgical history. Family Hx:  Family History  Problem Relation Age of Onset  . Dementia Mother    Social Hx:   Social History   Tobacco Use  . Smoking status: Current Every Day Smoker    Packs/day: 1.00    Types: Cigarettes  . Smokeless tobacco: Never Used  Substance Use Topics  . Alcohol use: Yes    Alcohol/week: 1.2 oz    Types: 2 Cans of beer per week    Comment: 3 cans per day  . Drug use: Never   Medication:    Current Facility-Administered Medications:  .  0.9 %  sodium chloride infusion, , Intravenous, Continuous, Kalisetti, Radhika, MD, Last Rate: 75 mL/hr at 09/28/17 0308 .  acetaminophen (TYLENOL) tablet 650 mg, 650 mg, Oral, Q6H PRN, [facility] L[facility], MD .  amLODipine (NORVASC) tablet 10 mg, 10 mg, Oral, Daily, [facility] Lighter, MD .  Ampicillin-Sulbactam (UNASYN) 3 g in sodium chloride 0.9 % 100 mL IVPB, 3 g, Intravenous, Q6H, Coffee, Donna Christen, RPH, Stopped at 09/28/17 0400 .  enoxaparin (LOVENOX) injection 40 mg, 40 mg, Subcutaneous, Q24H, [facility] L[facility], MD, 40 mg at 09/23/2017 2117 .  Influenza vac split quadrivalent PF (FLUZONE HIGH-DOSE) injection 0.5 mL, 0.5 mL, Intramuscular, Tomorrow-1000, Kalisetti, Radhika, MD .  losartan (COZAAR) tablet 100 mg, 100 mg, Oral, Daily,  Tressia Miners, Radhika, MD .  metoprolol succinate (TOPROL-XL) 24 hr tablet 25 mg, 25 mg, Oral, Daily, Kalisetti, Radhika, MD .  nicotine (NICODERM CQ - dosed in mg/24 hours) patch 21 mg, 21 mg, Transdermal, Daily, [facility] L[facility], MD, 21 mg at 09/10/2017 1713 .  ondansetron (ZOFRAN) injection 4 mg, 4 mg, Intravenous, Q6H PRN, [facility] L[facility], MD .  pneumococcal 23 valent vaccine (PNU-IMMUNE) injection 0.5 mL, 0.5 mL, Intramuscular, Tomorrow-1000, Kalisetti, Radhika, MD .  traZODone (DESYREL) tablet 50 mg, 50 mg, Oral, QHS PRN, Lance Coon, MD, 50 mg at 10/01/2017 2357   Allergies:  Aspirin  Review of Systems: Gen:  Denies  fever, sweats, chills HEENT: Denies blurred vision, double vision. bleeds, sore throat Cvc:  No dizziness, chest pain. Resp:   Denies cough or sputum production, shortness of breath Gi: Denies swallowing difficulty, stomach pain. Gu:  Denies bladder incontinence, burning urine Ext:   No Joint pain, stiffness. Skin: No skin rash,  hives  Endoc:  No polyuria, polydipsia. Psych: No depression, insomnia. Other:  All other systems were reviewed with the patient and were negative other that what is mentioned in the HPI.   Physical Examination:   VS: BP (!) 124/59   Pulse (!) 112   Temp 98 F (36.7 C) (Oral)   Resp 18   Ht _0  (1.676 m)   Wt 135 lb (61.2 kg)   SpO2 96%   BMI 21.79 kg/m   General Appearance: No distress  Neuro:without focal findings,  speech normal,  HEENT: PERRLA, EOM intact.   Pulmonary: normal breath sounds, No wheezing.  CardiovascularNormal S1,S2.  No m/r/g.   Abdomen: Benign, Soft, non-tender. Renal:  No costovertebral tenderness  GU:  No performed at this time. Endoc: No evident thyromegaly, no signs of acromegaly. Skin:   warm, no rashes, no ecchymosis  Extremities: normal, no cyanosis, clubbing.  Other findings:    LABORATORY PANEL:   CBC Recent Labs  Lab 09/28/17 0742  WBC 24.6*  HGB 9.1*  HCT 26.9*  PLT 261    ------------------------------------------------------------------------------------------------------------------  Chemistries  Recent Labs  Lab 09/28/17 0742  NA 143  K 3.1*  CL 105  CO2 27  GLUCOSE 57*  BUN 34*  CREATININE 0.67  CALCIUM 7.5*  AST 42*  ALT 25  ALKPHOS 135*  BILITOT 0.6   ------------------------------------------------------------------------------------------------------------------  Cardiac Enzymes Recent Labs  Lab 09/13/2017 2316  TROPONINI 0.15*   ------------------------------------------------------------  RADIOLOGY:  Dg Chest 2 View  Result Date: 09/18/2017 CLINICAL DATA:  Shortness of breath and hypoxia EXAM: CHEST - 2 VIEW COMPARISON:  None. FINDINGS: Cardiac shadow is within normal limits. Left lung demonstrates some mild interstitial changes. Diffuse consolidation is noted in the right upper lobe along the minor fissure. Patchy infiltrative changes are identified throughout the right lung. No sizable effusion is noted. No bony abnormality is seen. IMPRESSION: Multifocal infiltrates worse in the right upper lobe. Followup PA and lateral chest X-ray is recommended in 3-4 weeks following trial of antibiotic therapy to ensure resolution and exclude underlying malignancy. Electronically Signed   By: Elta Guadeloupe  Lukens M.D.   On: 09/08/2017 13:20   Ct Chest W Contrast  Result Date: 09/04/2017 CLINICAL DATA:  Cough. EXAM: CT CHEST WITH CONTRAST TECHNIQUE: Multidetector CT imaging of the chest was performed during intravenous contrast administration. CONTRAST:  86m ISOVUE-300 IOPAMIDOL (ISOVUE-300) INJECTION 61% COMPARISON:  None FINDINGS: Cardiovascular: The heart size appears normal. Aortic atherosclerosis identified. Calcification in the LAD, left circumflex and RCA coronary artery noted. No pericardial effusion. Mediastinum/Nodes: The thyroid gland appears normal. Unremarkable appearance of the trachea. The esophagus is normal. Right paratracheal lymph node  measures 1.3 cm, image 45/series 2. Lower right paratracheal lymph node measures 1.3 cm, image 62/series 2. Lungs/Pleura: No pleural effusion. Mild to moderate changes of centrilobular emphysema identified. Large, multifocal areas of dense airspace consolidation identified within the right upper lobe and right lower lobe. Central cavitation within the right upper lobe cavitation is identified compatible with necrosis, image 48/series 3. Patchy areas of ground-glass attenuation and airspace consolidation are identified within the remaining portions of the right lung and scattered throughout the left lung. Cavitary nodule within the periphery of the left upper lobe measures 2 cm, image 85/3. Solid non cavitary nodule within the left upper lobe is identified measuring 6 mm, image 32/series 3. Areas of subsegmental atelectasis identified within the posterior left lower lobe. Upper Abdomen: No acute abnormality. Musculoskeletal: No chest wall abnormality. No acute or significant osseous findings. Spondylosis noted within the thoracic spine. IMPRESSION: 1. Dense airspace consolidation with cavitation/necrosis is identified within the posterior right upper lobe. There is also extensive airspace consolidation throughout the right lower lobe. Elsewhere in the right lung there are scattered ground-glass and solid areas of nodular consolidation. In the acute setting findings are most consistent with multifocal infection with necrotizing pneumonia and pulmonary abscess formation. Alternatively, findings may reflect extensive necrotic tumor. Careful clinical correlation is advised and follow-up imaging is recommended to ensure resolution and to rule out underlying malignancy. 2. Cavitary, solid, and ground-glass nodules scattered within the left lung. Also favored to represent inflammatory/infectious process. Metastatic disease less favored but not excluded. 3. Enlarged mediastinal lymph nodes are identified which are  nonspecific in the setting of infection/inflammation. 4. Aortic Atherosclerosis (ICD10-I70.0) and Emphysema (ICD10-J43.9). Multi vessel coronary artery calcifications noted. Electronically Signed   By: TKerby MoorsM.D.   On: 09/03/2017 16:25       Thank  you for the consultation and for allowing [facility] to assist in the care of your patient. Our recommendations are noted above.  Please contact u[place]if we can be of further service.   DMarda Stalker MD.  Board Certified in Internal Medicine, Pulmonary Medicine, CCove and Sleep Medicine.  [facility] Office Number: [phone] DPatricia Pesa M.D.  DMerton Border M.D  09/28/2017

## 2017-09-28 NOTE — Progress Notes (Signed)
[facility] NAME: Kyle Bates    MR#:  [id number]  DATE OF BIRTH:  01-23-1951  SUBJECTIVE:  CHIEF COMPLAINT:   Chief Complaint  Patient presents with  . Shortness of Breath   Continues to have shortness of breath and cough.  Poor appetite. Family at bedside  He denied drinking any alcohol in the room but family tells me he drinks every day.  REVIEW OF SYSTEMS:    Review of Systems  Constitutional: Positive for chills, diaphoresis, malaise/fatigue and weight loss. Negative for fever.  HENT: Negative for sore throat.   Eyes: Negative for blurred vision, double vision and pain.  Respiratory: Positive for cough and shortness of breath. Negative for hemoptysis and wheezing.   Cardiovascular: Negative for chest pain, palpitations, orthopnea and leg swelling.  Gastrointestinal: Negative for abdominal pain, constipation, diarrhea, heartburn, nausea and vomiting.  Genitourinary: Negative for dysuria and hematuria.  Musculoskeletal: Negative for back pain and joint pain.  Skin: Negative for rash.  Neurological: Negative for sensory change, speech change, focal weakness and headaches.  Endo/Heme/Allergies: Does not bruise/bleed easily.  Psychiatric/Behavioral: Negative for depression. The patient is not nervous/anxious.     DRUG ALLERGIES:   Allergies  Allergen Reactions  . Aspirin     Upset stomach. Gi bleed    VITALS:  Blood pressure 126/61, pulse (!) 112, temperature 98.8 F (37.1 C), temperature source Oral, resp. rate 18, height _0  (1.676 m), weight 61.2 kg (135 lb), SpO2 93 %.  PHYSICAL EXAMINATION:   Physical Exam  GENERAL:  67 y.o.-year-old patient lying in the bed , frail EYES: Pupils equal, round, reactive to light and accommodation. No scleral icterus. Extraocular muscles intact.  HEENT: Head atraumatic, normocephalic. Oropharynx and nasopharynx clear.  NECK:  Supple, no jugular venous distention. No thyroid  enlargement, no tenderness.  LUNGS: Decreased air entry on the right side.  No wheezing. CARDIOVASCULAR: S1, S2 normal. No murmurs, rubs, or gallops.  ABDOMEN: Soft, nontender, nondistended. Bowel sounds present. No organomegaly or mass.  EXTREMITIES: No cyanosis, clubbing or edema b/l.    NEUROLOGIC: Cranial nerves II through XII are intact. No focal Motor or sensory deficits b/l.   PSYCHIATRIC: The patient is alert and oriented x 3.  SKIN: No obvious rash, lesion, or ulcer.  LABORATORY PANEL:   CBC Recent Labs  Lab 09/28/17 0742  WBC 24.6*  HGB 9.1*  HCT 26.9*  PLT 261   ------------------------------------------------------------------------------------------------------------------ Chemistries  Recent Labs  Lab 09/28/17 0742  NA 143  K 3.1*  CL 105  CO2 27  GLUCOSE 57*  BUN 34*  CREATININE 0.67  CALCIUM 7.5*  AST 42*  ALT 25  ALKPHOS 135*  BILITOT 0.6   ------------------------------------------------------------------------------------------------------------------  Cardiac Enzymes Recent Labs  Lab 09/26/2017 2316  TROPONINI 0.15*   ------------------------------------------------------------------------------------------------------------------  RADIOLOGY:  Dg Chest 2 View  Result Date: 09/04/2017 CLINICAL DATA:  Shortness of breath and hypoxia EXAM: CHEST - 2 VIEW COMPARISON:  None. FINDINGS: Cardiac shadow is within normal limits. Left lung demonstrates some mild interstitial changes. Diffuse consolidation is noted in the right upper lobe along the minor fissure. Patchy infiltrative changes are identified throughout the right lung. No sizable effusion is noted. No bony abnormality is seen. IMPRESSION: Multifocal infiltrates worse in the right upper lobe. Followup PA and lateral chest X-ray is recommended in 3-4 weeks following trial of antibiotic therapy to ensure resolution and exclude underlying malignancy. Electronically Signed   By: Linus Mako.D.  On:  09/22/2017 13:20   Ct Chest W Contrast  Result Date: 09/18/2017 CLINICAL DATA:  Cough. EXAM: CT CHEST WITH CONTRAST TECHNIQUE: Multidetector CT imaging of the chest was performed during intravenous contrast administration. CONTRAST:  9m ISOVUE-300 IOPAMIDOL (ISOVUE-300) INJECTION 61% COMPARISON:  None FINDINGS: Cardiovascular: The heart size appears normal. Aortic atherosclerosis identified. Calcification in the LAD, left circumflex and RCA coronary artery noted. No pericardial effusion. Mediastinum/Nodes: The thyroid gland appears normal. Unremarkable appearance of the trachea. The esophagus is normal. Right paratracheal lymph node measures 1.3 cm, image 45/series 2. Lower right paratracheal lymph node measures 1.3 cm, image 62/series 2. Lungs/Pleura: No pleural effusion. Mild to moderate changes of centrilobular emphysema identified. Large, multifocal areas of dense airspace consolidation identified within the right upper lobe and right lower lobe. Central cavitation within the right upper lobe cavitation is identified compatible with necrosis, image 48/series 3. Patchy areas of ground-glass attenuation and airspace consolidation are identified within the remaining portions of the right lung and scattered throughout the left lung. Cavitary nodule within the periphery of the left upper lobe measures 2 cm, image 85/3. Solid non cavitary nodule within the left upper lobe is identified measuring 6 mm, image 32/series 3. Areas of subsegmental atelectasis identified within the posterior left lower lobe. Upper Abdomen: No acute abnormality. Musculoskeletal: No chest wall abnormality. No acute or significant osseous findings. Spondylosis noted within the thoracic spine. IMPRESSION: 1. Dense airspace consolidation with cavitation/necrosis is identified within the posterior right upper lobe. There is also extensive airspace consolidation throughout the right lower lobe. Elsewhere in the right lung there are scattered  ground-glass and solid areas of nodular consolidation. In the acute setting findings are most consistent with multifocal infection with necrotizing pneumonia and pulmonary abscess formation. Alternatively, findings may reflect extensive necrotic tumor. Careful clinical correlation is advised and follow-up imaging is recommended to ensure resolution and to rule out underlying malignancy. 2. Cavitary, solid, and ground-glass nodules scattered within the left lung. Also favored to represent inflammatory/infectious process. Metastatic disease less favored but not excluded. 3. Enlarged mediastinal lymph nodes are identified which are nonspecific in the setting of infection/inflammation. 4. Aortic Atherosclerosis (ICD10-I70.0) and Emphysema (ICD10-J43.9). Multi vessel coronary artery calcifications noted. Electronically Signed   By: TKerby MoorsM.D.   On: 09/12/2017 16:25     ASSESSMENT AND PLAN:   CTavyn Kyle Bates is a 67y.o. male with a known history of left facial Bell's palsy, hypertension, ongoing smoking and alcohol use presents to hospital secondary to worsening weakness, difficulty breathing going on for several weeks now.  *Right-sided extensive pneumonia with cavitations likely due to aspiration.  Sepsis and acute hypoxic respiratory failure present on admission. On IV antibiotics.  Consulted pulmonary. Nebulizers as needed. Wean oxygen as tolerated. IV fluids.  *Hypokalemia.  Replace orally today.  *Weight loss.  CT scan of the chest does not show any clear mass.  It is unclear with his extensive pneumonia and the cavitary lesion.  He will need a repeat CT scan in 3 months. Patient does smoke and is at high risk for lung cancer.  *Elevated troponin likely demand ischemia.  Will also check CK level.  Consulted cardiology and appreciate input.  Will check echocardiogram.  Continue telemetry for 1 more day until we have echo results back.  * DVT prophylaxis- lovenox  * HTN-  continue losartan, norvasc and metoprolol  * Alcohol abuse Is unclear how much patient drinks.  Monitor for alcohol withdrawal.   All the records are  reviewed and case discussed with Care Management/Social Worker Management plans discussed with the patient, family and they are in agreement.  CODE STATUS: FULL CODE  DVT Prophylaxis: SCDs  TOTAL TIME TAKING CARE OF THIS PATIENT: 35 minutes.   POSSIBLE D/C IN 2-3 DAYS, DEPENDING ON CLINICAL CONDITION.  Leia Alf Bentlee Benningfield M.D on 09/28/2017 at 11:14 AM  Between 7am to 6pm - Pager - [phone]  After 6pm go to [email or web address] - password [email or web address] [facility]  Office  [phone]  CC: Primary care physician; Patient, No Pcp Per  Note: This dictation was prepared with Dragon dictation along with smaller phrase technology. Any transcriptional errors that result from this process are unintentional.

## 2017-09-28 NOTE — Progress Notes (Signed)
Verbal order from Dr. Darvin Neighbours to discontinue telemetry monitor.  Clarise Cruz, RN

## 2017-09-28 NOTE — Progress Notes (Signed)
During AM medication administration, writer and SN assisting noticed patient coughing with water. Family at bedside are concerned of decline in patient status since yesterday. SLP eval placed for concern of aspiration. Madlyn Frankel, RN

## 2017-09-28 NOTE — Evaluation (Signed)
Clinical/Bedside Swallow Evaluation Patient Details  Name: Kiron Osmun MRN: [id number] Date of Birth: March 11, 1951  Today's Date: 09/28/2017 Time: SLP Start Time (ACUTE ONLY): 1200 SLP Stop Time (ACUTE ONLY): 1300 SLP Time Calculation (min) (ACUTE ONLY): 60 min  Past Medical History:  Past Medical History:  Diagnosis Date  . Bell's palsy    left face  . Hypertension    Past Surgical History: History reviewed. No pertinent surgical history. HPI:  Pt is a 67 year old male admitted for hypoxia and pneumonia following c/o SOB.  PMH includes ETOH use, L side Bell's Palsy and HTN.  Severe Malnutrition related to chronic illness(ETOH abuse) as evidenced by severe fat depletion, severe muscle depletion; Dietitian following. Chest CT revealed Dense airspace consolidation with cavitation/necrosis is identified within the posterior right upper lobe. There is also extensive airspace consolidation throughout the right lower lobe. Elsewhere in the right lung there are scattered ground-glass and solid areas of nodular consolidation. In the acute setting findings are most consistent with multifocal infection with necrotizing pneumonia and pulmonary abscess formation. Alternatively, findings may reflect extensive necrotic tumor. Careful clinical correlation is advised.    Assessment / Plan / Recommendation Clinical Impression  Pt appears to present w/ oropharyngeal phase dysphagia w/ increased risk for aspiration of po's d/t declined Cognitive status. Pt does live alone it seems per report, "he is a private man", so unsure of his baseline Cognition or swallowing status; pt also has a dx of ETOH abuse and malnutrition per chart notes. He has not been admitted to the hospital previously. Pt is edentulous and exhibited intermittent decreased oral awareness during po trials. Therefore, trials of ice chips, Nectar liquids and purees were only assessed. Pt consumed these trials w/ adequate toleration w/ no overt  pharyngeal phase dysphagia noted; no immediate, overt s/s of aspiration noted - clear vocal quality post trials, no decline in respiratory status post trials. Pt exhibited fairly appropriate bolus management w/ these trial consistencies including full oral clearing post trials. Pt helped to hold cup to feed self but required moderate verbal/visual cues for follow through w/ po trials/tasks during session. Pt c/o pharyngeal mucous/phlegm - suspect this could be d/t dehydration at time of admission. Pt was unable to follow instruction to spit it out. Due to the presentation of the chest CT, and declined Cognitive status, recommend the modification of the diet to a dysphagia diet w/ aspiration precautions; Pills in puree for safer swallowing. ST services will f/u w/ toleration of diet and trials to upgrade when/if appropirate. Education given to family present.  SLP Visit Diagnosis: Dysphagia, oropharyngeal phase (R13.12)    Aspiration Risk  Mild aspiration risk;Moderate aspiration risk    Diet Recommendation  Dysphagia level 1 (puree) w/ NECTAR consistency liquids; aspiration precautions; feeding support at meals  Medication Administration: Crushed with puree(as able or in liquid form mixed in puree)    Other  Recommendations Recommended Consults: (Dietician following) Oral Care Recommendations: Oral care BID;Staff/trained caregiver to provide oral care Other Recommendations: Order thickener from pharmacy;Prohibited food (jello, ice cream, thin soups);Remove water pitcher;Have oral suction available   Follow up Recommendations Skilled Nursing facility      Frequency and Duration min 3x week  2 weeks       Prognosis Prognosis for Safe Diet Advancement: Fair Barriers to Reach Goals: Cognitive deficits;Severity of deficits      Swallow Study   General Date of Onset: 09/19/2017 HPI: Pt is a 67 year old male admitted for hypoxia and pneumonia  following c/o SOB.  PMH includes ETOH use, L side  Bell's Palsy and HTN.  Severe Malnutrition related to chronic illness(ETOH abuse) as evidenced by severe fat depletion, severe muscle depletion; Dietitian following. Chest CT revealed Dense airspace consolidation with cavitation/necrosis is identified within the posterior right upper lobe. There is also extensive airspace consolidation throughout the right lower lobe. Elsewhere in the right lung there are scattered ground-glass and solid areas of nodular consolidation. In the acute setting findings are most consistent with multifocal infection with necrotizing pneumonia and pulmonary abscess formation. Alternatively, findings may reflect extensive necrotic tumor. Careful clinical correlation is advised.  Type of Study: Bedside Swallow Evaluation Previous Swallow Assessment: none reported Diet Prior to this Study: Regular;Thin liquids Temperature Spikes Noted: No(wbc 24.6) Respiratory Status: Nasal cannula(2-4 liters) History of Recent Intubation: No Behavior/Cognition: Alert;Cooperative;Pleasant mood;Confused;Distractible;Requires cueing(post being sleepy) Oral Cavity Assessment: Excessive secretions(mucous) Oral Care Completed by SLP: Yes(attempted) Oral Cavity - Dentition: Edentulous Vision: Functional for self-feeding Self-Feeding Abilities: Able to feed self;Needs assist;Needs set up Patient Positioning: Upright in bed(kept scooting downward) Baseline Vocal Quality: Low vocal intensity(muttered speech) Volitional Cough: Cognitively unable to elicit Volitional Swallow: Unable to elicit    Oral/Motor/Sensory Function Overall Oral Motor/Sensory Function: (appeared wfl but could not follow OMEs)   Ice Chips Ice chips: Impaired Presentation: Spoon(fed; 4 trials) Oral Phase Impairments: (min decreased awareness; smacking on chips) Oral Phase Functional Implications: Prolonged oral transit Pharyngeal Phase Impairments: (none)   Thin Liquid Thin Liquid: Not tested Other Comments: d/t Cognitive  status currently    Nectar Thick Nectar Thick Liquid: Within functional limits Presentation: Self Fed;Straw(assisted; ~4 ozs) Other Comments: impulsive at times needing monitoring   Honey Thick Honey Thick Liquid: Not tested   Puree Puree: Within functional limits Presentation: Spoon(fed; 10 trials) Other Comments: min increased smacking w/ boluses but overall grossly wfl   Solid   GO   Solid: Not tested Other Comments: d/t Cognitive status; dentition status         Orinda Kenner, MS, CCC-SLP Watson,Katherine 09/28/2017,5:07 PM

## 2017-09-28 NOTE — Progress Notes (Signed)
Chaplain was asked to pray a [place] prayer. Family was at the bedside.  Family asked chaplain to pray a prayer of confession and salvation. Chaplin prayed and excused himself.   09/28/17 1100  Clinical Encounter Type  Visited With Patient and family together  Visit Type Initial;Spiritual support  Referral From Family  Spiritual Encounters  Spiritual Needs Prayer

## 2017-09-28 NOTE — Consult Note (Addendum)
Kyle Bates is a 67 y.o. male  [id number]  Primary Cardiologist: New patient to Dr. Neoma Laming Reason for Consultation: Elevated troponin  HPI: 67yo male with a past medical history of hypertension and 1ppd smoking history presents with worsening shortness of breath over the last month with 30lb unintentional weight loss, fatigue, and weakness. He denies chest pain or GI symptoms. No prior history of shortness of breath.    Review of Systems: Not having any chest pain or shortness of breath currently.    Past Medical History:  Diagnosis Date  . Bell's palsy    left face  . Hypertension     Medications Prior to Admission  Medication Sig Dispense Refill  . amLODipine (NORVASC) 10 MG tablet Take 10 mg by mouth daily.    Marland Kitchen losartan-hydrochlorothiazide (HYZAAR) 100-25 MG tablet Take 1 tablet by mouth daily.    . metoprolol succinate (TOPROL-XL) 25 MG 24 hr tablet Take 25 mg by mouth daily.       Marland Kitchen amLODipine  10 mg Oral Daily  . enoxaparin (LOVENOX) injection  40 mg Subcutaneous Q24H  . Influenza vac split quadrivalent PF  0.5 mL Intramuscular Tomorrow-1000  . losartan  100 mg Oral Daily  . metoprolol succinate  25 mg Oral Daily  . nicotine  21 mg Transdermal Daily  . pneumococcal 23 valent vaccine  0.5 mL Intramuscular Tomorrow-1000    Infusions: . sodium chloride 75 mL/hr at 09/28/17 0308  . ampicillin-sulbactam (UNASYN) IV Stopped (09/28/17 0400)    Allergies  Allergen Reactions  . Aspirin     Upset stomach. Gi bleed    Social History   Socioeconomic History  . Marital status: Single    Spouse name: Not on file  . Number of children: Not on file  . Years of education: Not on file  . Highest education level: Not on file  Occupational History  . Not on file  Social Needs  . Financial resource strain: Not on file  . Food insecurity:    Worry: Not on file    Inability: Not on file  . Transportation needs:    Medical: Not on file    Non-medical: Not on  file  Tobacco Use  . Smoking status: Current Every Day Smoker    Packs/day: 1.00    Types: Cigarettes  . Smokeless tobacco: Never Used  Substance and Sexual Activity  . Alcohol use: Yes    Alcohol/week: 1.2 oz    Types: 2 Cans of beer per week    Comment: 3 cans per day  . Drug use: Never  . Sexual activity: Not on file  Lifestyle  . Physical activity:    Days per week: Not on file    Minutes per session: Not on file  . Stress: Not on file  Relationships  . Social connections:    Talks on phone: Not on file    Gets together: Not on file    Attends religious service: Not on file    Active member of club or organization: Not on file    Attends meetings of clubs or organizations: Not on file    Relationship status: Not on file  . Intimate partner violence:    Fear of current or ex partner: Not on file    Emotionally abused: Not on file    Physically abused: Not on file    Forced sexual activity: Not on file  Other Topics Concern  . Not on file  Social History Narrative  Independent at baseline, lives at home by himself.    Family History  Problem Relation Age of Onset  . Dementia Mother     PHYSICAL EXAM: Vitals:   09/28/17 0259 09/28/17 0539  BP: (!) 94/34 (!) 124/59  Pulse: (!) 111 (!) 112  Resp: 18   Temp: 98 F (36.7 C)   SpO2: 96%      Intake/Output Summary (Last 24 hours) at 09/28/2017 0844 Last data filed at 09/28/2017 0509 Gross per 24 hour  Intake 3100.83 ml  Output 800 ml  Net 2300.83 ml    General:  Cachectic, slumped in bed  HEENT: normal Cor: PMI nondisplaced. Regular rate & rhythm. No rubs, gallops or murmurs. Lungs: clear Abdomen: soft, nontender, nondistended. No hepatosplenomegaly. No bruits or masses. Good bowel sounds. Extremities: no cyanosis, clubbing, rash, edema Neuro: Lethargic  ECG: Sinus tachycardia 119bpm  Results for orders placed or performed during the hospital encounter of 09/03/2017 (from the past 24 hour(s))  CBC with  Differential     Status: Abnormal   Collection Time: 09/15/2017 11:52 AM  Result Value Ref Range   WBC 22.1 (H) 3.8 - 10.6 K/uL   RBC 3.89 (L) 4.40 - 5.90 MIL/uL   Hemoglobin 11.6 (L) 13.0 - 18.0 g/dL   HCT 35.3 (L) 40.0 - 52.0 %   MCV 90.7 80.0 - 100.0 fL   MCH 29.9 26.0 - 34.0 pg   MCHC 32.9 32.0 - 36.0 g/dL   RDW 12.8 11.5 - 14.5 %   Platelets 380 150 - 440 K/uL   Neutrophils Relative % 94 %   Lymphocytes Relative 5 %   Monocytes Relative 1 %   Eosinophils Relative 0 %   Basophils Relative 0 %   Neutro Abs 20.8 (H) 1.4 - 6.5 K/uL   Lymphs Abs 1.1 1.0 - 3.6 K/uL   Monocytes Absolute 0.2 0.2 - 1.0 K/uL   Eosinophils Absolute 0.0 0 - 0.7 K/uL   Basophils Absolute 0.0 0 - 0.1 K/uL   RBC Morphology MIXED RBC POPULATION    WBC Morphology TOXIC GRANULATION   Comprehensive metabolic panel     Status: Abnormal   Collection Time: 09/09/2017 11:52 AM  Result Value Ref Range   Sodium 138 135 - 145 mmol/L   Potassium 2.8 (L) 3.5 - 5.1 mmol/L   Chloride 91 (L) 101 - 111 mmol/L   CO2 28 22 - 32 mmol/L   Glucose, Bld 111 (H) 65 - 99 mg/dL   BUN 61 (H) 6 - 20 mg/dL   Creatinine, Ser 1.22 0.61 - 1.24 mg/dL   Calcium 8.1 (L) 8.9 - 10.3 mg/dL   Total Protein 6.4 (L) 6.5 - 8.1 g/dL   Albumin 1.6 (L) 3.5 - 5.0 g/dL   AST 41 15 - 41 U/L   ALT 19 17 - 63 U/L   Alkaline Phosphatase 167 (H) 38 - 126 U/L   Total Bilirubin 0.6 0.3 - 1.2 mg/dL   GFR calc non Af Amer >60 >60 mL/min   GFR calc Af Amer >60 >60 mL/min   Anion gap 19 (H) 5 - 15  Troponin I     Status: Abnormal   Collection Time: 09/29/2017 11:52 AM  Result Value Ref Range   Troponin I 0.07 (HH) <0.03 ng/mL  Brain natriuretic peptide     Status: Abnormal   Collection Time: 09/19/2017 11:52 AM  Result Value Ref Range   B Natriuretic Peptide 176.0 (H) 0.0 - 100.0 pg/mL  Influenza panel by PCR (  type A & B)     Status: None   Collection Time: 09/07/2017 12:20 PM  Result Value Ref Range   Influenza A By PCR NEGATIVE NEGATIVE   Influenza B  By PCR NEGATIVE NEGATIVE  Fibrin derivatives D-Dimer ([facility] only)     Status: Abnormal   Collection Time: 09/04/2017  1:17 PM  Result Value Ref Range   Fibrin derivatives D-dimer ([facility]) 1,451.29 (H) 0.00 - 499.00 ng/mL (FEU)  Culture, blood (routine x 2)     Status: None (Preliminary result)   Collection Time: 09/20/2017  2:10 PM  Result Value Ref Range   Specimen Description BLOOD LEFT FA    Special Requests      BOTTLES DRAWN AEROBIC AND ANAEROBIC Blood Culture adequate volume   Culture      NO GROWTH < 24 HOURS Performed at [facility], [address] [place], [place] [address]    Report Status PENDING   Culture, blood (routine x 2)     Status: None (Preliminary result)   Collection Time: 09/18/2017  2:10 PM  Result Value Ref Range   Specimen Description BLOOD RIGHT FA    Special Requests      BOTTLES DRAWN AEROBIC AND ANAEROBIC Blood Culture adequate volume   Culture      NO GROWTH < 24 HOURS Performed at [facility], [address] [place], [place] [address]    Report Status PENDING   Lactic acid, plasma     Status: Abnormal   Collection Time: 09/04/2017  2:10 PM  Result Value Ref Range   Lactic Acid, Venous 3.2 (HH) 0.5 - 1.9 mmol/L  Urinalysis, Complete w Microscopic     Status: Abnormal   Collection Time: 09/09/2017  5:00 PM  Result Value Ref Range   Color, Urine YELLOW (A) YELLOW   APPearance CLEAR (A) CLEAR   Specific Gravity, Urine 1.028 1.005 - 1.030   pH 5.0 5.0 - 8.0   Glucose, UA NEGATIVE NEGATIVE mg/dL   Hgb urine dipstick NEGATIVE NEGATIVE   Bilirubin Urine NEGATIVE NEGATIVE   Ketones, ur NEGATIVE NEGATIVE mg/dL   Protein, ur NEGATIVE NEGATIVE mg/dL   Nitrite NEGATIVE NEGATIVE   Leukocytes, UA NEGATIVE NEGATIVE   RBC / HPF 0-5 0 - 5 RBC/hpf   WBC, UA 0-5 0 - 5 WBC/hpf   Bacteria, UA NONE SEEN NONE SEEN   Squamous Epithelial / LPF NONE SEEN NONE SEEN   Hyaline Casts, UA PRESENT   Lactic acid, plasma     Status: Abnormal   Collection  Time: 10/01/2017  5:30 PM  Result Value Ref Range   Lactic Acid, Venous 3.7 (HH) 0.5 - 1.9 mmol/L  TSH     Status: None   Collection Time: 10/01/2017  5:30 PM  Result Value Ref Range   TSH 0.434 0.350 - 4.500 uIU/mL  Troponin I     Status: Abnormal   Collection Time: 09/21/2017  5:30 PM  Result Value Ref Range   Troponin I 1.32 (HH) <0.03 ng/mL  Troponin I     Status: Abnormal   Collection Time: 09/05/2017 11:16 PM  Result Value Ref Range   Troponin I 0.15 (HH) <0.03 ng/mL  CBC     Status: Abnormal   Collection Time: 09/15/2017 11:16 PM  Result Value Ref Range   WBC 18.5 (H) 3.8 - 10.6 K/uL   RBC 3.38 (L) 4.40 - 5.90 MIL/uL   Hemoglobin 10.0 (L) 13.0 - 18.0 g/dL   HCT 30.6 (L) 40.0 -  52.0 %   MCV 90.4 80.0 - 100.0 fL   MCH 29.7 26.0 - 34.0 pg   MCHC 32.8 32.0 - 36.0 g/dL   RDW 12.6 11.5 - 14.5 %   Platelets 350 150 - 440 K/uL  Lactic acid, plasma     Status: Abnormal   Collection Time: 09/28/17  1:08 AM  Result Value Ref Range   Lactic Acid, Venous 2.2 (HH) 0.5 - 1.9 mmol/L  CK     Status: Abnormal   Collection Time: 09/28/17  7:42 AM  Result Value Ref Range   Total CK 27 (L) 49 - 397 U/L  Comprehensive metabolic panel     Status: Abnormal   Collection Time: 09/28/17  7:42 AM  Result Value Ref Range   Sodium 143 135 - 145 mmol/L   Potassium 3.1 (L) 3.5 - 5.1 mmol/L   Chloride 105 101 - 111 mmol/L   CO2 27 22 - 32 mmol/L   Glucose, Bld 57 (L) 65 - 99 mg/dL   BUN 34 (H) 6 - 20 mg/dL   Creatinine, Ser 0.67 0.61 - 1.24 mg/dL   Calcium 7.5 (L) 8.9 - 10.3 mg/dL   Total Protein 5.2 (L) 6.5 - 8.1 g/dL   Albumin 1.3 (L) 3.5 - 5.0 g/dL   AST 42 (H) 15 - 41 U/L   ALT 25 17 - 63 U/L   Alkaline Phosphatase 135 (H) 38 - 126 U/L   Total Bilirubin 0.6 0.3 - 1.2 mg/dL   GFR calc non Af Amer >60 >60 mL/min   GFR calc Af Amer >60 >60 mL/min   Anion gap 11 5 - 15  CBC with Differential/Platelet     Status: Abnormal (Preliminary result)   Collection Time: 09/28/17  7:42 AM  Result Value Ref  Range   WBC PENDING 4.0 - 10.5 K/uL   RBC 3.01 (L) 4.40 - 5.90 MIL/uL   Hemoglobin 9.1 (L) 13.0 - 18.0 g/dL   HCT 26.9 (L) 40.0 - 52.0 %   MCV 89.4 80.0 - 100.0 fL   MCH 30.1 26.0 - 34.0 pg   MCHC 33.7 32.0 - 36.0 g/dL   RDW 12.8 11.5 - 14.5 %   Platelets 261 150 - 440 K/uL   Neutrophils Relative % PENDING %   Neutro Abs PENDING 1.7 - 7.7 K/uL   Band Neutrophils PENDING %   Lymphocytes Relative PENDING %   Lymphs Abs PENDING 0.7 - 4.0 K/uL   Monocytes Relative PENDING %   Monocytes Absolute PENDING 0.1 - 1.0 K/uL   Eosinophils Relative PENDING %   Eosinophils Absolute PENDING 0.0 - 0.7 K/uL   Basophils Relative PENDING %   Basophils Absolute PENDING 0.0 - 0.1 K/uL   WBC Morphology PENDING    RBC Morphology PENDING    Smear Review PENDING    Other PENDING %   nRBC PENDING 0 /100 WBC   Metamyelocytes Relative PENDING %   Myelocytes PENDING %   Promyelocytes Absolute PENDING %   Blasts PENDING %   Dg Chest 2 View  Result Date: 09/11/2017 CLINICAL DATA:  Shortness of breath and hypoxia EXAM: CHEST - 2 VIEW COMPARISON:  None. FINDINGS: Cardiac shadow is within normal limits. Left lung demonstrates some mild interstitial changes. Diffuse consolidation is noted in the right upper lobe along the minor fissure. Patchy infiltrative changes are identified throughout the right lung. No sizable effusion is noted. No bony abnormality is seen. IMPRESSION: Multifocal infiltrates worse in the right upper lobe. Followup PA  and lateral chest X-ray is recommended in 3-4 weeks following trial of antibiotic therapy to ensure resolution and exclude underlying malignancy. Electronically Signed   By: Inez Catalina M.D.   On: 10/01/2017 13:20   Ct Chest W Contrast  Result Date: 10/02/2017 CLINICAL DATA:  Cough. EXAM: CT CHEST WITH CONTRAST TECHNIQUE: Multidetector CT imaging of the chest was performed during intravenous contrast administration. CONTRAST:  41m ISOVUE-300 IOPAMIDOL (ISOVUE-300) INJECTION  61% COMPARISON:  None FINDINGS: Cardiovascular: The heart size appears normal. Aortic atherosclerosis identified. Calcification in the LAD, left circumflex and RCA coronary artery noted. No pericardial effusion. Mediastinum/Nodes: The thyroid gland appears normal. Unremarkable appearance of the trachea. The esophagus is normal. Right paratracheal lymph node measures 1.3 cm, image 45/series 2. Lower right paratracheal lymph node measures 1.3 cm, image 62/series 2. Lungs/Pleura: No pleural effusion. Mild to moderate changes of centrilobular emphysema identified. Large, multifocal areas of dense airspace consolidation identified within the right upper lobe and right lower lobe. Central cavitation within the right upper lobe cavitation is identified compatible with necrosis, image 48/series 3. Patchy areas of ground-glass attenuation and airspace consolidation are identified within the remaining portions of the right lung and scattered throughout the left lung. Cavitary nodule within the periphery of the left upper lobe measures 2 cm, image 85/3. Solid non cavitary nodule within the left upper lobe is identified measuring 6 mm, image 32/series 3. Areas of subsegmental atelectasis identified within the posterior left lower lobe. Upper Abdomen: No acute abnormality. Musculoskeletal: No chest wall abnormality. No acute or significant osseous findings. Spondylosis noted within the thoracic spine. IMPRESSION: 1. Dense airspace consolidation with cavitation/necrosis is identified within the posterior right upper lobe. There is also extensive airspace consolidation throughout the right lower lobe. Elsewhere in the right lung there are scattered ground-glass and solid areas of nodular consolidation. In the acute setting findings are most consistent with multifocal infection with necrotizing pneumonia and pulmonary abscess formation. Alternatively, findings may reflect extensive necrotic tumor. Careful clinical correlation is  advised and follow-up imaging is recommended to ensure resolution and to rule out underlying malignancy. 2. Cavitary, solid, and ground-glass nodules scattered within the left lung. Also favored to represent inflammatory/infectious process. Metastatic disease less favored but not excluded. 3. Enlarged mediastinal lymph nodes are identified which are nonspecific in the setting of infection/inflammation. 4. Aortic Atherosclerosis (ICD10-I70.0) and Emphysema (ICD10-J43.9). Multi vessel coronary artery calcifications noted. Electronically Signed   By: TKerby MoorsM.D.   On: 09/18/2017 16:25     ASSESSMENT AND PLAN:  Pneumonia with sepsis, sinus tachycardia with elevated troponin: Troponin elevated to max of 1.32, but down to 0.15 this morning. Advise echo and will continue to monitor cardiac status. May consider cardiac cath after patient is treated for pneumonia and lung neoplasm is ruled out. May consider cath later on this week VS outpatient cta coronries.  KJake Bathe NP-C Cell: [phone]

## 2017-09-28 NOTE — Evaluation (Signed)
Physical Therapy Evaluation Patient Details Name: Kyle Bates MRN: [id number] DOB: 09-03-1950 Today's Date: 09/28/2017   History of Present Illness  Pt is a 67 year old male admitted for hypoxia and pneumonia following c/o SOB.  PMH includes L side Bell's Palsy and Htn.  Clinical Impression  Pt is a 67 year old male who lives in a one story home alone. He is reported by his daughter-in-law to be independent at baseline.  Pt is in bed and lethargic upon PT arrival.  He agreed to attempt to transfer to recliner but was unable to remain standing once STS transfer was completed.  He appeared SOB and O2 level remained WNL during evaluation.  Pt was able to perform bed mobility with min hand held assist but experienced posterolateral LOB if PT did not continue to cue for upright posture.  Pt presented with generalized weakness of bilateral UE/LE.  Pt was not safe to attempt ambulation and requested to return to bed.  PT discussed possibility of need for SNF with family and all agreed that pt is not at baseline and that family is available to check in with pt intermittently but not 24 hrs/day.  Pt will continue to benefit from skilled PT with focus on strength, tolerance to activity, functional mobility and safe use of AD.    Follow Up Recommendations SNF    Equipment Recommendations  Rolling walker with 5" wheels    Recommendations for Other Services       Precautions / Restrictions Restrictions Weight Bearing Restrictions: No      Mobility  Bed Mobility Overal bed mobility: Needs Assistance Bed Mobility: Supine to Sit     Supine to sit: Mod assist     General bed mobility comments: Requires assistance to complete sitting transfer to upright position.  Pt requires VC's to maintain upright posture.  Transfers Overall transfer level: Needs assistance Equipment used: Rolling walker (2 wheeled) Transfers: Sit to/from Stand Sit to Stand: Min assist         General transfer comment:  Pt able to initiate STS with RW but requires Min A to stand upright and VC's for management of RW.  Ambulation/Gait Ambulation/Gait assistance: (Did not perform.  Unable to assess.)              Stairs            Wheelchair Mobility    Modified Rankin (Stroke Patients Only)       Balance Overall balance assessment: Needs assistance Sitting-balance support: Bilateral upper extremity supported;Feet supported     Postural control: Left lateral lean;Posterior lean Standing balance support: Bilateral upper extremity supported                                 Pertinent Vitals/Pain      Home Living                        Prior Function                 Hand Dominance        Extremity/Trunk Assessment   Upper Extremity Assessment Upper Extremity Assessment: Generalized weakness    Lower Extremity Assessment Lower Extremity Assessment: Generalized weakness    Cervical / Trunk Assessment Cervical / Trunk Assessment: Kyphotic  Communication      Cognition  General Comments      Exercises     Assessment/Plan    PT Assessment Patient needs continued PT services  PT Problem List Decreased strength;Decreased mobility;Decreased safety awareness;Decreased knowledge of use of DME;Decreased activity tolerance       PT Treatment Interventions DME instruction;Therapeutic activities;Cognitive remediation;Gait training;Therapeutic exercise;Patient/family education;Stair training;Balance training;Functional mobility training;Neuromuscular re-education    PT Goals (Current goals can be found in the Care Plan section)  Acute Rehab PT Goals Patient Stated Goal: To return home as soon as possible. PT Goal Formulation: With patient/family Time For Goal Achievement: 10/12/17 Potential to Achieve Goals: Good    Frequency Min 2X/week   Barriers to discharge         Co-evaluation               AM-PAC PT "6 Clicks" Daily Activity  Outcome Measure Difficulty turning over in bed (including adjusting bedclothes, sheets and blankets)?: A Little Difficulty moving from lying on back to sitting on the side of the bed? : A Little Difficulty sitting down on and standing up from a chair with arms (e.g., wheelchair, bedside commode, etc,.)?: A Lot Help needed moving to and from a bed to chair (including a wheelchair)?: A Lot Help needed walking in hospital room?: A Lot Help needed climbing 3-5 steps with a railing? : A Lot 6 Click Score: 14    End of Session Equipment Utilized During Treatment: Gait belt Activity Tolerance: Patient limited by lethargy;Patient limited by fatigue Patient left: in bed;with bed alarm set;with family/visitor present;with call bell/phone within reach   PT Visit Diagnosis: Unsteadiness on feet (R26.81);Muscle weakness (generalized) (M62.81)    Time: 1130-1200 PT Time Calculation (min) (ACUTE ONLY): 30 min   Charges:   PT Evaluation $PT Eval Low Complexity: 1 Low PT Treatments $Therapeutic Activity: 8-22 mins   PT G Codes:   PT G-Codes **NOT FOR INPATIENT CLASS** Functional Assessment Tool Used: AM-PAC 6 Clicks Basic Mobility   Roxanne Gates, PT, DPT   Roxanne Gates 09/28/2017, 12:05 PM

## 2017-09-28 NOTE — Progress Notes (Signed)
Initial Nutrition Assessment  DOCUMENTATION CODES:   Severe malnutrition in context of chronic illness  INTERVENTION:  Provide Hormel Shake (Vital Cuisine) TID with trays, each supplement provides 520 kcal and 22 grams of protein.  Provide Magic cup TID with meals, each supplement provides 290 kcal and 9 grams of protein.  Continue MVI daily, thiamine 076 mg daily, folic acid 1 mg daily.  NUTRITION DIAGNOSIS:   Severe Malnutrition related to chronic illness(EtOH abuse) as evidenced by severe fat depletion, severe muscle depletion.  GOAL:   Patient will meet greater than or equal to 90% of their needs  MONITOR:   PO intake, Supplement acceptance, Diet advancement, Labs, Weight trends, Skin, I & O's  REASON FOR ASSESSMENT:   Malnutrition Screening Tool    ASSESSMENT:   67 year old male with PMHx of left facial Bell's palsy, HTN, daily alcohol use who presents with weakness and difficulty breathing admitted with right-sided PNA with cavitations likely due to aspiration.   -Following SLP evaluation today patient was placed on dysphagia 1 diet with nectar-thick liquids.  Met with patient and his family members at bedside. Patient unable to provide any history at this time. Family members report he lives at home alone. They are unsure of how he has been eating lately.   They report he used to weigh 180-200 lbs over the summer and has lost a significant amount of weight since. Attempted to get bed scale weight but even after straightening bed could not get scale to take weight. No weight history in chart to trend.   Medications reviewed and include: folic acid 1 mg daily, MVI daily, thiamine 100 mg daily, NS @ 75 mL/hr, Unasyn, azithromycin, potassium chloride 10 mEq IV 6 times today.  Labs reviewed: Potassium 3.1, Glucose 47, BUN 34. Magnesium WNL at 2.4 today.  Discussed with RN. Patient likely withdrawing now from EtOH. Family reporting he drinks daily.  NUTRITION - FOCUSED  PHYSICAL EXAM:    Most Recent Value  Orbital Region  Severe depletion  Upper Arm Region  Severe depletion  Thoracic and Lumbar Region  Severe depletion  Buccal Region  Severe depletion  Temple Region  Severe depletion  Clavicle Bone Region  Severe depletion  Clavicle and Acromion Bone Region  Severe depletion  Scapular Bone Region  Severe depletion  Dorsal Hand  Severe depletion  Patellar Region  Severe depletion  Anterior Thigh Region  Severe depletion  Posterior Calf Region  Severe depletion  Edema (RD Assessment)  None  Hair  Reviewed  Eyes  Unable to assess  Mouth  Unable to assess  Skin  Reviewed  Nails  Reviewed     Diet Order:  DIET - DYS 1 Room service appropriate? Yes with Assist; Fluid consistency: Nectar Thick Aspiration precautions Fall precautions  EDUCATION NEEDS:   Not appropriate for education at this time  Skin:  Skin Assessment: Reviewed RN Assessment(ecchymosis to arm)  Last BM:  09/28/2017 - medium type 7  Height:   Ht Readings from Last 1 Encounters:  09/22/2017 _0  (1.676 m)    Weight:   Wt Readings from Last 1 Encounters:  09/25/2017 135 lb (61.2 kg)    Ideal Body Weight:  59.1 kg  BMI:  Body mass index is 21.79 kg/m.  Estimated Nutritional Needs:   Kcal:  1615-1880 (MSJ x 1.2-1.4)  Protein:  85-100 grams (1.4-1.6 grams/kg)  Fluid:  1.8-2.1 L/day (30-35 mL/kg)  Willey Blade, MS, RD, LDN Office: [phone] Pager: [phone] After Hours/Weekend Pager: [phone]

## 2017-09-29 ENCOUNTER — Inpatient Hospital Stay: Payer: No Typology Code available for payment source

## 2017-09-29 DIAGNOSIS — J850 Gangrene and necrosis of lung: Secondary | ICD-10-CM

## 2017-09-29 DIAGNOSIS — E43 Unspecified severe protein-calorie malnutrition: Secondary | ICD-10-CM

## 2017-09-29 DIAGNOSIS — F1020 Alcohol dependence, uncomplicated: Secondary | ICD-10-CM

## 2017-09-29 LAB — HIV ANTIBODY (ROUTINE TESTING W REFLEX): HIV SCREEN 4TH GENERATION: NONREACTIVE

## 2017-09-29 LAB — BLOOD GAS, ARTERIAL
Acid-Base Excess: 8.3 mmol/L — ABNORMAL HIGH (ref 0.0–2.0)
Acid-Base Excess: 8.1 mmol/L — ABNORMAL HIGH (ref 0.0–2.0)
Bicarbonate: 33.4 mmol/L — ABNORMAL HIGH (ref 20.0–28.0)
Bicarbonate: 35.9 mmol/L — ABNORMAL HIGH (ref 20.0–28.0)
Delivery systems: POSITIVE
Expiratory PAP: 5
FIO2: 0.36
FIO2: 1
Inspiratory PAP: 15
LHR: 8 {breaths}/min
O2 Saturation: 92.1 %
O2 Saturation: 99.7 %
PATIENT TEMPERATURE: 36.9
PCO2 ART: 48 mmHg (ref 32.0–48.0)
PCO2 ART: 68 mmHg — AB (ref 32.0–48.0)
PH ART: 7.33 — AB (ref 7.350–7.450)
PO2 ART: 61 mmHg — AB (ref 83.0–108.0)
Patient temperature: 37
pH, Arterial: 7.45 (ref 7.350–7.450)
pO2, Arterial: 218 mmHg — ABNORMAL HIGH (ref 83.0–108.0)

## 2017-09-29 LAB — CBC WITH DIFFERENTIAL/PLATELET
BASOS PCT: 1 %
Band Neutrophils: 7 %
Basophils Absolute: 0.3 10*3/uL — ABNORMAL HIGH (ref 0–0.1)
Blasts: 0 %
Eosinophils Absolute: 0 10*3/uL (ref 0–0.7)
Eosinophils Relative: 0 %
HCT: 32.5 % — ABNORMAL LOW (ref 40.0–52.0)
Hemoglobin: 10.5 g/dL — ABNORMAL LOW (ref 13.0–18.0)
LYMPHS PCT: 9 %
Lymphs Abs: 2.3 10*3/uL (ref 1.0–3.6)
MCH: 30.1 pg (ref 26.0–34.0)
MCHC: 32.4 g/dL (ref 32.0–36.0)
MCV: 92.7 fL (ref 80.0–100.0)
MONO ABS: 0.3 10*3/uL (ref 0.2–1.0)
Metamyelocytes Relative: 1 %
Monocytes Relative: 1 %
Myelocytes: 0 %
NEUTROS PCT: 81 %
NRBC: 0 /100{WBCs}
Neutro Abs: 22.6 10*3/uL — ABNORMAL HIGH (ref 1.4–6.5)
OTHER: 0 %
PLATELETS: 157 10*3/uL (ref 150–440)
PROMYELOCYTES ABS: 0 %
RBC: 3.5 MIL/uL — ABNORMAL LOW (ref 4.40–5.90)
RDW: 13.1 % (ref 11.5–14.5)
WBC: 25.5 10*3/uL — ABNORMAL HIGH (ref 3.8–10.6)

## 2017-09-29 LAB — BASIC METABOLIC PANEL
Anion gap: 15 (ref 5–15)
BUN: 23 mg/dL — AB (ref 6–20)
CHLORIDE: 107 mmol/L (ref 101–111)
CO2: 25 mmol/L (ref 22–32)
Calcium: 8.4 mg/dL — ABNORMAL LOW (ref 8.9–10.3)
Creatinine, Ser: 0.56 mg/dL — ABNORMAL LOW (ref 0.61–1.24)
GFR calc Af Amer: >60 mL/min (ref >60)
GFR calc non Af Amer: >60 mL/min (ref >60)
Glucose, Bld: 108 mg/dL — ABNORMAL HIGH (ref 65–99)
POTASSIUM: 4.2 mmol/L (ref 3.5–5.1)
Sodium: 147 mmol/L — ABNORMAL HIGH (ref 135–145)

## 2017-09-29 LAB — GLUCOSE, CAPILLARY: Glucose-Capillary: 84 mg/dL (ref 65–99)

## 2017-09-29 LAB — LACTIC ACID, PLASMA: LACTIC ACID, VENOUS: 2.9 mmol/L — AB (ref 0.5–1.9)

## 2017-09-29 LAB — MRSA PCR SCREENING: MRSA by PCR: NEGATIVE

## 2017-09-29 LAB — PATHOLOGIST SMEAR REVIEW

## 2017-09-29 LAB — PHOSPHORUS: Phosphorus: 4.7 mg/dL — ABNORMAL HIGH (ref 2.5–4.6)

## 2017-09-29 MED ORDER — CHLORHEXIDINE GLUCONATE 0.12 % MT SOLN
15.0000 mL | Freq: Two times a day (BID) | OROMUCOSAL | Status: DC
Start: 2017-09-29 — End: 2017-10-03
  Administered 2017-09-29 – 2017-10-02 (×6): 15 mL via OROMUCOSAL
  Filled 2017-09-29 (×4): qty 15

## 2017-09-29 MED ORDER — IPRATROPIUM-ALBUTEROL 0.5-2.5 (3) MG/3ML IN SOLN
3.0000 mL | Freq: Four times a day (QID) | RESPIRATORY_TRACT | Status: DC
Start: 2017-09-29 — End: 2017-10-01
  Administered 2017-09-29 – 2017-10-01 (×9): 3 mL via RESPIRATORY_TRACT
  Filled 2017-09-29 (×10): qty 3

## 2017-09-29 MED ORDER — BUDESONIDE 0.25 MG/2ML IN SUSP
0.2500 mg | Freq: Two times a day (BID) | RESPIRATORY_TRACT | Status: DC
  Administered 2017-09-29 – 2017-10-01 (×5): 0.25 mg via RESPIRATORY_TRACT
  Filled 2017-09-29 (×5): qty 2

## 2017-09-29 MED ORDER — MORPHINE SULFATE (PF) 2 MG/ML IV SOLN
INTRAVENOUS | Status: AC
  Administered 2017-09-29: 2 mg via INTRAVENOUS
  Filled 2017-09-29: qty 1

## 2017-09-29 MED ORDER — MORPHINE SULFATE (PF) 2 MG/ML IV SOLN
2.0000 mg | INTRAVENOUS | Status: DC | PRN
  Administered 2017-09-29 – 2017-09-30 (×4): 2 mg via INTRAVENOUS
  Filled 2017-09-29 (×3): qty 1

## 2017-09-29 MED ORDER — METOPROLOL TARTRATE 5 MG/5ML IV SOLN
2.5000 mg | Freq: Four times a day (QID) | INTRAVENOUS | Status: DC
  Administered 2017-09-29 – 2017-09-30 (×4): 2.5 mg via INTRAVENOUS
  Filled 2017-09-29 (×4): qty 5

## 2017-09-29 MED ORDER — VANCOMYCIN HCL IN DEXTROSE 1-5 GM/200ML-% IV SOLN
1000.0000 mg | Freq: Two times a day (BID) | INTRAVENOUS | Status: DC
  Administered 2017-09-29 – 2017-09-30 (×4): 1000 mg via INTRAVENOUS
  Filled 2017-09-29 (×5): qty 200

## 2017-09-29 MED ORDER — DEXTROSE-NACL 5-0.45 % IV SOLN
INTRAVENOUS | Status: DC
  Administered 2017-09-29 – 2017-10-01 (×3): via INTRAVENOUS

## 2017-09-29 MED ORDER — LORAZEPAM 2 MG/ML IJ SOLN
1.0000 mg | Freq: Four times a day (QID) | INTRAMUSCULAR | Status: DC | PRN
  Administered 2017-09-29 – 2017-09-30 (×3): 1 mg via INTRAVENOUS
  Filled 2017-09-29 (×3): qty 1

## 2017-09-29 MED ORDER — VANCOMYCIN HCL IN DEXTROSE 1-5 GM/200ML-% IV SOLN
1000.0000 mg | Freq: Once | INTRAVENOUS | Status: AC
  Administered 2017-09-29: 1000 mg via INTRAVENOUS
  Filled 2017-09-29: qty 200

## 2017-09-29 MED ORDER — ORAL CARE MOUTH RINSE
15.0000 mL | Freq: Two times a day (BID) | OROMUCOSAL | Status: DC
Start: 2017-09-29 — End: 2017-10-03
  Administered 2017-09-29 – 2017-10-02 (×5): 15 mL via OROMUCOSAL

## 2017-09-29 MED ORDER — METHYLPREDNISOLONE SODIUM SUCC 40 MG IJ SOLR
40.0000 mg | Freq: Two times a day (BID) | INTRAMUSCULAR | Status: DC
  Administered 2017-09-29 – 2017-10-01 (×4): 40 mg via INTRAVENOUS
  Filled 2017-09-29 (×4): qty 1

## 2017-09-29 MED ORDER — THIAMINE HCL 100 MG/ML IJ SOLN
Freq: Every day | INTRAVENOUS | Status: DC
  Administered 2017-09-29 – 2017-09-30 (×2): via INTRAVENOUS
  Filled 2017-09-29 (×3): qty 1000

## 2017-09-29 MED ORDER — DEXTROSE-NACL 5-0.45 % IV SOLN: INTRAVENOUS | Status: DC

## 2017-09-29 NOTE — Progress Notes (Signed)
SLP Cancellation Note  Patient Details Name: Kyle Bates MRN: [id number] DOB: 07-19-1950   Cancelled treatment:       Reason Eval/Treat Not Completed: Medical issues which prohibited therapy;Patient not medically ready(chart reviewed) Pt had a decline in medical/respiratory status' and was transferred to CCU; now on BIPAP for increased O2 support. ST services will f/u when appropriate for dysphagia therapy. Pt is currently NPO.   Orinda Kenner, MS, CCC-SLP [facility] 09/29/2017, 2:46 PM

## 2017-09-29 NOTE — Progress Notes (Signed)
PT Cancellation Note  Patient Details Name: Kyle Bates MRN: [id number] DOB: Jun 03, 1951   Cancelled Treatment:    Reason Eval/Treat Not Completed: Medical issues which prohibited therapy(Per chart review, patient noted with transfer to CCU due to decline in respiratory status.  Per policy, will require new orders to resume services. Please re-consult as medically appropriate.)   Ruchy Wildrick H. Owens Shark, PT, DPT, NCS 09/29/17, 11:19 PM [phone]

## 2017-09-29 NOTE — Progress Notes (Signed)
Pt continues to decline.  O2 sats dropped on 4L, non rebreather placed.  MD and [facility] at bedside decided for a higher level of care and transfer to ICU.  Family at bedside.

## 2017-09-29 NOTE — Progress Notes (Addendum)
Pharmacy Antibiotic Note  Kyle Bates is a 67 y.o. male admitted on 09/09/2017 with pneumonia.  Pharmacy has been consulted for vancomycin dosing.  Plan: Patient received vanc 1g IV x 1  Will continue vanc 1g IV q12h w/ 6 hour stack dose. Will draw vanc trough 03/28 @ 2000 prior to 4th dose.  Ke 0.0705 T1/2 12 hrs Goal trough 15 - 20 mcg/mL  MRSA PCR ordered  Height: 5' 6" (167.6 cm) Weight: 135 lb (61.2 kg) IBW/kg (Calculated) : 63.8  Temp (24hrs), Avg:98.3 F (36.8 C), Min:98 F (36.7 C), Max:98.8 F (37.1 C)  Recent Labs  Lab 09/10/2017 1152 09/28/2017 1410 09/09/2017 1730 09/16/2017 2316 09/28/17 0108 09/28/17 0742  WBC 22.1*  --   --  18.5*  --  24.6*  CREATININE 1.22  --   --   --   --  0.67  LATICACIDVEN  --  3.2* 3.7*  --  2.2*  --     Estimated Creatinine Clearance: 79.7 mL/min (by C-G formula based on SCr of 0.67 mg/dL).    Allergies  Allergen Reactions  . Aspirin     Upset stomach. Gi bleed    Thank you for allowing pharmacy to be a part of this patient's care.  Tobie Lords, PharmD, BCPS Clinical Pharmacist 09/29/2017

## 2017-09-29 NOTE — Progress Notes (Signed)
Said nurse noted pts respiratory rate increasing during the night and still tachycardic in the 120s-130s.  O2 sats were in the 80's, O2 increased to 4L.  MD notified.  STAT ABG and STAT chest xray ordered.  MD responded with vancomycin order and HFNC.  Yaunker suctioning effective for sputum removal.  Pt is not responding to instructions, but he is agitated at times.  Midnight Ativan not given d/t respiratory issues at the time.  Will reevaluate.

## 2017-09-29 NOTE — Progress Notes (Signed)
BIPAP placed on Pt. 15/5 FiO2 100% . Transported to CCU 20 on BIPAP. Pt. Tolerating well at this time. Will continue to monitor.

## 2017-09-29 NOTE — Progress Notes (Signed)
Pt. Restless and agitated at intervals pulling at nasal cannula. Pt. Unable to tolerate HFNC at this time. Will continue to assess. SpO2 90% on 4L.

## 2017-09-29 NOTE — Progress Notes (Signed)
[place] [facility] [place] NAME: Kyle Bates    MR#:  [id number]  DATE OF BIRTH:  May 07, 1951  SUBJECTIVE:  CHIEF COMPLAINT:   Chief Complaint  Patient presents with  . Shortness of Breath   Patient worsened overnight with his breathing.  Initially placed on high flow nasal cannula and later transition to CCU early a.m. Today.  Presently patient on BiPAP.  Minimal response to tactile stimulation.  REVIEW OF SYSTEMS:    Review of Systems  Constitutional: Positive for chills, diaphoresis, malaise/fatigue and weight loss. Negative for fever.  HENT: Negative for sore throat.   Eyes: Negative for blurred vision, double vision and pain.  Respiratory: Positive for cough and shortness of breath. Negative for hemoptysis and wheezing.   Cardiovascular: Negative for chest pain, palpitations, orthopnea and leg swelling.  Gastrointestinal: Negative for abdominal pain, constipation, diarrhea, heartburn, nausea and vomiting.  Genitourinary: Negative for dysuria and hematuria.  Musculoskeletal: Negative for back pain and joint pain.  Skin: Negative for rash.  Neurological: Negative for sensory change, speech change, focal weakness and headaches.  Endo/Heme/Allergies: Does not bruise/bleed easily.  Psychiatric/Behavioral: Negative for depression. The patient is not nervous/anxious.     DRUG ALLERGIES:   Allergies  Allergen Reactions  . Aspirin     Upset stomach. Gi bleed    VITALS:  Blood pressure (!) 156/82, pulse (!) 110, temperature 98.4 F (36.9 C), temperature source Axillary, resp. rate 18, height _0  (1.676 m), weight 52.6 kg (115 lb 15.4 oz), SpO2 100 %.  PHYSICAL EXAMINATION:   Physical Exam  GENERAL:  67 y.o.-year-old patient lying in the bed, critically ill on BiPAP EYES: Pupils equal, round, reactive to light and accommodation.  HEENT: Head atraumatic, normocephalic.   NECK:  Supple, no jugular venous distention. No thyroid  enlargement, no tenderness.  LUNGS: Decreased air entry on the right side.  No wheezing. CARDIOVASCULAR: S1, S2 normal. No murmurs, rubs, or gallops.  ABDOMEN: Soft, nontender, nondistended. Bowel sounds present. No organomegaly or mass.  EXTREMITIES: No cyanosis, clubbing or edema b/l.    NEUROLOGIC: Not following instructions PSYCHIATRIC: The patient is lethargic SKIN: No obvious rash, lesion, or ulcer.  LABORATORY PANEL:   CBC Recent Labs  Lab 09/28/17 0742  WBC 24.6*  HGB 9.1*  HCT 26.9*  PLT 261   ------------------------------------------------------------------------------------------------------------------ Chemistries  Recent Labs  Lab 09/28/17 0742 09/28/17 1022  NA 143  --   K 3.1*  --   CL 105  --   CO2 27  --   GLUCOSE 57*  --   BUN 34*  --   CREATININE 0.67  --   CALCIUM 7.5*  --   MG  --  2.4  AST 42*  --   ALT 25  --   ALKPHOS 135*  --   BILITOT 0.6  --    ------------------------------------------------------------------------------------------------------------------  Cardiac Enzymes Recent Labs  Lab 09/28/2017 2316  TROPONINI 0.15*   ------------------------------------------------------------------------------------------------------------------  RADIOLOGY:  Dg Chest 2 View  Result Date: 09/07/2017 CLINICAL DATA:  Shortness of breath and hypoxia EXAM: CHEST - 2 VIEW COMPARISON:  None. FINDINGS: Cardiac shadow is within normal limits. Left lung demonstrates some mild interstitial changes. Diffuse consolidation is noted in the right upper lobe along the minor fissure. Patchy infiltrative changes are identified throughout the right lung. No sizable effusion is noted. No bony abnormality is seen. IMPRESSION: Multifocal infiltrates worse in the right upper lobe. Followup PA and lateral chest X-ray is recommended  in 3-4 weeks following trial of antibiotic therapy to ensure resolution and exclude underlying malignancy. Electronically Signed   By: Inez Catalina M.D.   On: 09/15/2017 13:20   Ct Chest W Contrast  Result Date: 09/16/2017 CLINICAL DATA:  Cough. EXAM: CT CHEST WITH CONTRAST TECHNIQUE: Multidetector CT imaging of the chest was performed during intravenous contrast administration. CONTRAST:  63m ISOVUE-300 IOPAMIDOL (ISOVUE-300) INJECTION 61% COMPARISON:  None FINDINGS: Cardiovascular: The heart size appears normal. Aortic atherosclerosis identified. Calcification in the LAD, left circumflex and RCA coronary artery noted. No pericardial effusion. Mediastinum/Nodes: The thyroid gland appears normal. Unremarkable appearance of the trachea. The esophagus is normal. Right paratracheal lymph node measures 1.3 cm, image 45/series 2. Lower right paratracheal lymph node measures 1.3 cm, image 62/series 2. Lungs/Pleura: No pleural effusion. Mild to moderate changes of centrilobular emphysema identified. Large, multifocal areas of dense airspace consolidation identified within the right upper lobe and right lower lobe. Central cavitation within the right upper lobe cavitation is identified compatible with necrosis, image 48/series 3. Patchy areas of ground-glass attenuation and airspace consolidation are identified within the remaining portions of the right lung and scattered throughout the left lung. Cavitary nodule within the periphery of the left upper lobe measures 2 cm, image 85/3. Solid non cavitary nodule within the left upper lobe is identified measuring 6 mm, image 32/series 3. Areas of subsegmental atelectasis identified within the posterior left lower lobe. Upper Abdomen: No acute abnormality. Musculoskeletal: No chest wall abnormality. No acute or significant osseous findings. Spondylosis noted within the thoracic spine. IMPRESSION: 1. Dense airspace consolidation with cavitation/necrosis is identified within the posterior right upper lobe. There is also extensive airspace consolidation throughout the right lower lobe. Elsewhere in the right lung  there are scattered ground-glass and solid areas of nodular consolidation. In the acute setting findings are most consistent with multifocal infection with necrotizing pneumonia and pulmonary abscess formation. Alternatively, findings may reflect extensive necrotic tumor. Careful clinical correlation is advised and follow-up imaging is recommended to ensure resolution and to rule out underlying malignancy. 2. Cavitary, solid, and ground-glass nodules scattered within the left lung. Also favored to represent inflammatory/infectious process. Metastatic disease less favored but not excluded. 3. Enlarged mediastinal lymph nodes are identified which are nonspecific in the setting of infection/inflammation. 4. Aortic Atherosclerosis (ICD10-I70.0) and Emphysema (ICD10-J43.9). Multi vessel coronary artery calcifications noted. Electronically Signed   By: TKerby MoorsM.D.   On: 09/16/2017 16:25   Dg Chest Port 1 View  Result Date: 09/29/2017 CLINICAL DATA:  67year old male with cough. Necrotizing pneumonia +/- necrotic tumor. EXAM: PORTABLE CHEST 1 VIEW COMPARISON:  Chest CT and radiographs 09/06/2017 FINDINGS: Portable AP upright view at 0154 hours. The lung bases are not entirely included. Multilobar consolidation redemonstrated in the right lung, worst along the minor fissure. Areas of right upper lobe and middle lobe cavitation were better demonstrated by CT. No superimposed pneumothorax. Patchy peribronchial opacity has increased in the left mid lung. Stable visible mediastinal contour. Visualized tracheal air column is within normal limits. IMPRESSION: 1. Radiographic progression of left lung bronchopneumonia since 09/17/2017. 2. Multilobar necrotizing right lung pneumonia not significantly changed. 3. Lung bases not entirely included. Electronically Signed   By: HGenevie AnnM.D.   On: 09/29/2017 02:50     ASSESSMENT AND PLAN:   CSage Kopera is a 67y.o. male with a known history of left facial Bell's  palsy, hypertension, ongoing smoking and alcohol use presents to hospital secondary to worsening weakness, difficulty  breathing going on for several weeks now.  *Acute on chronic hypoxic and hypercapnic respiratory failure Continue BiPAP support Family is trying to decide between full code and DO NOT RESUSCITATE status. Discussed with Dr. Marvel Plan in the ICU.  *Right-sided extensive pneumonia with cavitations likely due to aspiration.  Sepsis poa On IV antibiotics.  Nebulizers as needed. IV fluids.  *Weight loss.  CT scan of the chest does not show any clear mass.  It is unclear with his extensive pneumonia and the cavitary lesion.  He will need a repeat CT scan in 3 months. Patient does smoke and is at high risk for lung cancer.  *Elevated troponin likely demand ischemia.   Echocardiogram ordered and pending.  Appreciate cardiology input.  * DVT prophylaxis- lovenox  * Alcohol abuse and withdrawals.  On CIWA protocol.   Patient is critically ill with high risk for deterioration/death.  Poor health condition even prior to admission.  All the records are reviewed and case discussed with Care Management/Social Worker Management plans discussed with the patient, family and they are in agreement.  CODE STATUS: FULL CODE  DVT Prophylaxis: SCDs  TOTAL CC TIME TAKING CARE OF THIS PATIENT: 35 minutes.   Neita Carp M.D on 09/29/2017 at 9:27 AM  Between 7am to 6pm - Pager - [phone]  After 6pm go to [email or web address] - password [email or web address] [facility]  Office  [phone]  CC: Primary care physician; Patient, No Pcp Per  Note: This dictation was prepared with Dragon dictation along with smaller phrase technology. Any transcriptional errors that result from this process are unintentional.

## 2017-09-29 NOTE — Progress Notes (Signed)
Date: 09/29/2017    HPI:  Patient is a 67 year old male who presented to the ED by EMS with progressive shortness over the last month with increasing progressed over the last few days.  He is lost approximately 30 pounds unintentionally over the last few months.  He has a history of ongoing alcohol and tobacco abuse, with current use of about a pack of cigarettes per day and drink about 3-5 cans of beer every night.  He does not have any previous history other than Bell's palsy or hypertension, there are no previous encounters in our system.  Review of lab studies shows mildly elevated troponin initially of 1.32, since decreased to 0.15. Venous lactic acid was elevated at 3.7, which has since come down to 2.2, the patient also had leukocytosis of 18.5, subsequently increased to 24.6, left shift and toxic granulation, influenza negative. Above seems to suggest sepsis, complicated by NSTEMI.  Significant Event: 03/25-Pt admitted to [facility] unit per HPI above 03/25-CT Chest>>Dense airspace consolidation with cavitation/necrosis is identified within the posterior right upper lobe. There is also extensive airspace consolidation throughout the right lower lobe. Elsewhere in the right lung there are scattered ground glass and solid areas of nodular consolidation. In the acute setting findings are most consistent with multifocal infection with necrotizing pneumonia and pulmonary abscess formation. Alternatively, findings may reflect extensive necrotic tumor. Careful clinical correlation is advised and follow-up imaging is recommended to ensure resolution and to rule out underlying malignancy. Cavitary, solid, and ground-glass nodules scattered within the left lung. Also favored to represent inflammatory/infectious process. Metastatic disease less favored but not excluded. Enlarged mediastinal lymph nodes are identified which are nonspecific in the setting of infection/inflammation. Aortic Atherosclerosis  (ICD10-I70.0) and Emphysema (ICD10-J43.9). Multi vessel coronary artery calcifications noted. 03/27-Pt transferred to stepdown unit due to worsening respiratory failure   Physical Examination:   VS: BP 124/72   Pulse (!) 120   Temp 98 F (36.7 C) (Oral)   Resp 18   Ht _0  (1.676 m)   Wt 61.2 kg (135 lb)   SpO2 90%   BMI 21.79 kg/m   General Appearance: acutely ill appearing male on Bipap   Neuro: obtunded, not following commands, right pupil reactive, left pupil non reactive  HEENT: supple, mild JVD  Pulmonary: diffuse rhonchi throughout, tachypnic on Bipap  Cardiovascular: sinus tach, no M/R/G  Abdomen: +BS x4, soft, non distended  Skin: intact no rashes or lesions  Extremities: cachetic, trace bilateral edema   LABORATORY PANEL:   CBC Recent Labs  Lab 09/28/17 0742  WBC 24.6*  HGB 9.1*  HCT 26.9*  PLT 261    Chemistries  Recent Labs  Lab 09/28/17 0742 09/28/17 1022  NA 143  --   K 3.1*  --   CL 105  --   CO2 27  --   GLUCOSE 57*  --   BUN 34*  --   CREATININE 0.67  --   CALCIUM 7.5*  --   MG  --  2.4  AST 42*  --   ALT 25  --   ALKPHOS 135*  --   BILITOT 0.6  --     Cardiac Enzymes Recent Labs  Lab 10/02/2017 2316  TROPONINI 0.15*    RADIOLOGY:  Dg Chest 2 View  Result Date: 09/10/2017 CLINICAL DATA:  Shortness of breath and hypoxia EXAM: CHEST - 2 VIEW COMPARISON:  None. FINDINGS: Cardiac shadow is within normal limits. Left lung demonstrates some mild interstitial changes. Diffuse consolidation  is noted in the right upper lobe along the minor fissure. Patchy infiltrative changes are identified throughout the right lung. No sizable effusion is noted. No bony abnormality is seen. IMPRESSION: Multifocal infiltrates worse in the right upper lobe. Followup PA and lateral chest X-ray is recommended in 3-4 weeks following trial of antibiotic therapy to ensure resolution and exclude underlying malignancy. Electronically Signed   By: Inez Catalina M.D.    On: 09/26/2017 13:20   Ct Chest W Contrast  Result Date: 09/12/2017 CLINICAL DATA:  Cough. EXAM: CT CHEST WITH CONTRAST TECHNIQUE: Multidetector CT imaging of the chest was performed during intravenous contrast administration. CONTRAST:  54m ISOVUE-300 IOPAMIDOL (ISOVUE-300) INJECTION 61% COMPARISON:  None FINDINGS: Cardiovascular: The heart size appears normal. Aortic atherosclerosis identified. Calcification in the LAD, left circumflex and RCA coronary artery noted. No pericardial effusion. Mediastinum/Nodes: The thyroid gland appears normal. Unremarkable appearance of the trachea. The esophagus is normal. Right paratracheal lymph node measures 1.3 cm, image 45/series 2. Lower right paratracheal lymph node measures 1.3 cm, image 62/series 2. Lungs/Pleura: No pleural effusion. Mild to moderate changes of centrilobular emphysema identified. Large, multifocal areas of dense airspace consolidation identified within the right upper lobe and right lower lobe. Central cavitation within the right upper lobe cavitation is identified compatible with necrosis, image 48/series 3. Patchy areas of ground-glass attenuation and airspace consolidation are identified within the remaining portions of the right lung and scattered throughout the left lung. Cavitary nodule within the periphery of the left upper lobe measures 2 cm, image 85/3. Solid non cavitary nodule within the left upper lobe is identified measuring 6 mm, image 32/series 3. Areas of subsegmental atelectasis identified within the posterior left lower lobe. Upper Abdomen: No acute abnormality. Musculoskeletal: No chest wall abnormality. No acute or significant osseous findings. Spondylosis noted within the thoracic spine. IMPRESSION: 1. Dense airspace consolidation with cavitation/necrosis is identified within the posterior right upper lobe. There is also extensive airspace consolidation throughout the right lower lobe. Elsewhere in the right lung there are  scattered ground-glass and solid areas of nodular consolidation. In the acute setting findings are most consistent with multifocal infection with necrotizing pneumonia and pulmonary abscess formation. Alternatively, findings may reflect extensive necrotic tumor. Careful clinical correlation is advised and follow-up imaging is recommended to ensure resolution and to rule out underlying malignancy. 2. Cavitary, solid, and ground-glass nodules scattered within the left lung. Also favored to represent inflammatory/infectious process. Metastatic disease less favored but not excluded. 3. Enlarged mediastinal lymph nodes are identified which are nonspecific in the setting of infection/inflammation. 4. Aortic Atherosclerosis (ICD10-I70.0) and Emphysema (ICD10-J43.9). Multi vessel coronary artery calcifications noted. Electronically Signed   By: TKerby MoorsM.D.   On: 09/03/2017 16:25   Dg Chest Port 1 View  Result Date: 09/29/2017 CLINICAL DATA:  67year old male with cough. Necrotizing pneumonia +/- necrotic tumor. EXAM: PORTABLE CHEST 1 VIEW COMPARISON:  Chest CT and radiographs 09/08/2017 FINDINGS: Portable AP upright view at 0154 hours. The lung bases are not entirely included. Multilobar consolidation redemonstrated in the right lung, worst along the minor fissure. Areas of right upper lobe and middle lobe cavitation were better demonstrated by CT. No superimposed pneumothorax. Patchy peribronchial opacity has increased in the left mid lung. Stable visible mediastinal contour. Visualized tracheal air column is within normal limits. IMPRESSION: 1. Radiographic progression of left lung bronchopneumonia since 09/03/2017. 2. Multilobar necrotizing right lung pneumonia not significantly changed. 3. Lung bases not entirely included. Electronically Signed   By: HGenevie Ann  M.D.   On: 09/29/2017 02:50     Assessment and Plan: Severe cavitary necrotic pneumonia with sepsis -Continue unasyn, vancomycin, and azithromycin    --TB quantiferon results pending  --Scheduled bronchodilator therapy --IV and nebulized steroids  --Recheck CT chest outpatient after completing treatment, may require bronch, would need cardiac clearance before-hand.  --Pt HIGH RISK FOR INTUBATION  Emphysema, seen on CT chest --Anoro upon DC, nebs prn.   Possible NSTEMI mild increase in troponins --Cardiology consulted appreciate input  --Continuous telemetry monitoring  --Continue current cardiac medications   Anemia without obvious acute blood loss  --Lovenox for VTE prophylaxis  --Trend CBC  --Monitor for s/sx of bleeding --Transfuse for hgb <7  Severe Malnutrition --Dietitian consulted appreciate input   Nicotine Abuse --Will need smoking cessation counseling once mentation improves  ETOH Abuse  --CIWA protocol  --Continue thiamine, folic acid, and mvi  --ETOH abuse cessation counseling once mentation improves   -Updated pts family regarding change in pt condition and HIGH RISK FOR INTUBATION they are currently waiting on additional family members to discuss goals of treatment and code status  Marda Stalker, [place] Pager [phone] (please enter 7 digits) PCCM Consult Pager [phone] (please enter 7 digits)

## 2017-09-29 NOTE — Progress Notes (Signed)
SUBJECTIVE: Pt is sleeping on BiPAP. Appears to be comfortable.   Vitals:   09/29/17 0035 09/29/17 0230 09/29/17 0637 09/29/17 0800  BP: 124/72  140/88 (!) 156/82  Pulse: (!) 120  (!) 110   Resp:      Temp:   98.4 F (36.9 C)   TempSrc:   Axillary Axillary  SpO2:  90% 100%   Weight:   115 lb 15.4 oz (52.6 kg)   Height:   _0  (1.676 m)     Intake/Output Summary (Last 24 hours) at 09/29/2017 0841 Last data filed at 09/29/2017 2800 Gross per 24 hour  Intake 1855 ml  Output 475 ml  Net 1380 ml    LABS: Basic Metabolic Panel: Recent Labs    09/17/2017 1152 09/28/17 0742 09/28/17 1022  NA 138 143  --   K 2.8* 3.1*  --   CL 91* 105  --   CO2 28 27  --   GLUCOSE 111* 57*  --   BUN 61* 34*  --   CREATININE 1.22 0.67  --   CALCIUM 8.1* 7.5*  --   MG  --   --  2.4   Liver Function Tests: Recent Labs    10/01/2017 1152 09/28/17 0742  AST 41 42*  ALT 19 25  ALKPHOS 167* 135*  BILITOT 0.6 0.6  PROT 6.4* 5.2*  ALBUMIN 1.6* 1.3*   No results for input(s): LIPASE, AMYLASE in the last 72 hours. CBC: Recent Labs    09/03/2017 1152 09/11/2017 2316 09/28/17 0742  WBC 22.1* 18.5* 24.6*  NEUTROABS 20.8*  --  23.2*  HGB 11.6* 10.0* 9.1*  HCT 35.3* 30.6* 26.9*  MCV 90.7 90.4 89.4  PLT 380 350 261   Cardiac Enzymes: Recent Labs    09/14/2017 1152 09/23/2017 1730 09/25/2017 2316 09/28/17 0742  CKTOTAL  --   --   --  27*  TROPONINI 0.07* 1.32* 0.15*  --    BNP: Invalid input(s): POCBNP D-Dimer: No results for input(s): DDIMER in the last 72 hours. Hemoglobin A1C: No results for input(s): HGBA1C in the last 72 hours. Fasting Lipid Panel: No results for input(s): CHOL, HDL, LDLCALC, TRIG, CHOLHDL, LDLDIRECT in the last 72 hours. Thyroid Function Tests: Recent Labs    09/17/2017 1730  TSH 0.434   Anemia Panel: No results for input(s): VITAMINB12, FOLATE, FERRITIN, TIBC, IRON, RETICCTPCT in the last 72 hours.   PHYSICAL EXAM General: Cachetic, pale HEENT:   Normocephalic and atramatic Neck:  No JVD.  Lungs: Clear bilaterally to auscultation and percussion. Heart: HRRR . Normal S1 and S2 without gallops or murmurs.  Abdomen: Bowel sounds are positive, abdomen soft and non-tender  Msk:  Back normal, normal gait. Normal strength and tone for age. Extremities: Cachetic, no edema   Neuro: Alert and oriented X 3. Psych:  Good affect, responds appropriately  TELEMETRY: Sinus tachycardia 109bpm  ASSESSMENT AND PLAN:  Echo results: The right ventricular systolic pressure was increased consistent with moderate pulmonary hypertension. Normal LVEF, mild diastolicdysfunction and normal wall motion, with mild MR and Mitral valve Prolapse.  Elevated troponin: Troponin 0.15, CK was not elevated, echo shows normal wall motion. Pulmonary hypertension likely due to lung disease and troponin likely due to demand ischemia. Will continue to monitor cardiac status and consider cardiac cath or outpatient CCTA once respiratory status is stable and pneumonia is treated.  Active Problems:   Pneumonia   Protein-calorie malnutrition, severe    Jake Bathe, NP-C 09/29/2017 8:41 AM Cell: [phone]

## 2017-09-30 DIAGNOSIS — J9601 Acute respiratory failure with hypoxia: Secondary | ICD-10-CM

## 2017-09-30 DIAGNOSIS — Z7189 Other specified counseling: Secondary | ICD-10-CM

## 2017-09-30 DIAGNOSIS — J189 Pneumonia, unspecified organism: Secondary | ICD-10-CM

## 2017-09-30 DIAGNOSIS — Z515 Encounter for palliative care: Secondary | ICD-10-CM

## 2017-09-30 DIAGNOSIS — J9690 Respiratory failure, unspecified, unspecified whether with hypoxia or hypercapnia: Secondary | ICD-10-CM

## 2017-09-30 DIAGNOSIS — I248 Other forms of acute ischemic heart disease: Secondary | ICD-10-CM

## 2017-09-30 DIAGNOSIS — J9602 Acute respiratory failure with hypercapnia: Secondary | ICD-10-CM

## 2017-09-30 DIAGNOSIS — F1010 Alcohol abuse, uncomplicated: Secondary | ICD-10-CM

## 2017-09-30 LAB — CBC WITH DIFFERENTIAL/PLATELET
Basophils Absolute: 0.1 10*3/uL (ref 0–0.1)
Basophils Relative: 0 %
EOS PCT: 0 %
Eosinophils Absolute: 0 10*3/uL (ref 0–0.7)
HCT: 30.7 % — ABNORMAL LOW (ref 40.0–52.0)
Hemoglobin: 9.8 g/dL — ABNORMAL LOW (ref 13.0–18.0)
LYMPHS PCT: 5 %
Lymphs Abs: 1.3 10*3/uL (ref 1.0–3.6)
MCH: 29.5 pg (ref 26.0–34.0)
MCHC: 31.8 g/dL — AB (ref 32.0–36.0)
MCV: 92.8 fL (ref 80.0–100.0)
MONO ABS: 0.3 10*3/uL (ref 0.2–1.0)
Monocytes Relative: 1 %
Neutro Abs: 25.4 10*3/uL — ABNORMAL HIGH (ref 1.4–6.5)
Neutrophils Relative %: 94 %
PLATELETS: 122 10*3/uL — AB (ref 150–440)
RBC: 3.3 MIL/uL — ABNORMAL LOW (ref 4.40–5.90)
RDW: 13.2 % (ref 11.5–14.5)
WBC: 27.1 10*3/uL — ABNORMAL HIGH (ref 3.8–10.6)

## 2017-09-30 LAB — COMPREHENSIVE METABOLIC PANEL
ALBUMIN: 1.4 g/dL — AB (ref 3.5–5.0)
ALK PHOS: 126 U/L (ref 38–126)
ALT: 19 U/L (ref 17–63)
AST: 28 U/L (ref 15–41)
Anion gap: 11 (ref 5–15)
BUN: 22 mg/dL — AB (ref 6–20)
CALCIUM: 8.1 mg/dL — AB (ref 8.9–10.3)
CO2: 30 mmol/L (ref 22–32)
Chloride: 106 mmol/L (ref 101–111)
Creatinine, Ser: 0.5 mg/dL — ABNORMAL LOW (ref 0.61–1.24)
GFR calc Af Amer: >60 mL/min (ref >60)
GFR calc non Af Amer: >60 mL/min (ref >60)
GLUCOSE: 181 mg/dL — AB (ref 65–99)
POTASSIUM: 3.1 mmol/L — AB (ref 3.5–5.1)
Sodium: 147 mmol/L — ABNORMAL HIGH (ref 135–145)
Total Bilirubin: 0.6 mg/dL (ref 0.3–1.2)
Total Protein: 5.7 g/dL — ABNORMAL LOW (ref 6.5–8.1)

## 2017-09-30 LAB — BLOOD GAS, ARTERIAL
Acid-Base Excess: 11.3 mmol/L — ABNORMAL HIGH (ref 0.0–2.0)
Bicarbonate: 36.8 mmol/L — ABNORMAL HIGH (ref 20.0–28.0)
DELIVERY SYSTEMS: POSITIVE
EXPIRATORY PAP: 5
FIO2: 0.24
INSPIRATORY PAP: 15
LHR: 8 {breaths}/min
O2 Saturation: 92.1 %
Patient temperature: 37
pCO2 arterial: 53 mmHg — ABNORMAL HIGH (ref 32.0–48.0)
pH, Arterial: 7.45 (ref 7.350–7.450)
pO2, Arterial: 61 mmHg — ABNORMAL LOW (ref 83.0–108.0)

## 2017-09-30 LAB — VANCOMYCIN, TROUGH: VANCOMYCIN TR: 38 ug/mL — AB (ref 15–20)

## 2017-09-30 LAB — POTASSIUM: Potassium: 2.8 mmol/L — ABNORMAL LOW (ref 3.5–5.1)

## 2017-09-30 LAB — PHOSPHORUS: Phosphorus: 3.9 mg/dL (ref 2.5–4.6)

## 2017-09-30 LAB — MAGNESIUM: Magnesium: 1.9 mg/dL (ref 1.7–2.4)

## 2017-09-30 MED ORDER — SODIUM CHLORIDE 0.9% FLUSH
10.0000 mL | Freq: Two times a day (BID) | INTRAVENOUS | Status: DC
  Administered 2017-09-30 (×2): 10 mL

## 2017-09-30 MED ORDER — POTASSIUM CHLORIDE 10 MEQ/100ML IV SOLN
10.0000 meq | INTRAVENOUS | Status: AC
  Administered 2017-10-01 (×6): 10 meq via INTRAVENOUS
  Filled 2017-09-30 (×6): qty 100

## 2017-09-30 MED ORDER — SODIUM CHLORIDE 0.9% FLUSH: 10.0000 mL | INTRAVENOUS | Status: DC | PRN

## 2017-09-30 MED ORDER — DEXMEDETOMIDINE HCL IN NACL 400 MCG/100ML IV SOLN
0.0000 ug/kg/h | INTRAVENOUS | Status: DC
  Administered 2017-09-30: 0.4 ug/kg/h via INTRAVENOUS
  Administered 2017-09-30: 0.6 ug/kg/h via INTRAVENOUS
  Administered 2017-10-01: 0.4 ug/kg/h via INTRAVENOUS
  Filled 2017-09-30 (×3): qty 100

## 2017-09-30 NOTE — Progress Notes (Signed)
[place] [facility] [place] NAME: Jsoeph Podesta    MR#:  [id number]  DATE OF BIRTH:  10-05-1950  SUBJECTIVE:  CHIEF COMPLAINT:   Chief Complaint  Patient presents with  . Shortness of Breath   On bipap. Poorly responsive  REVIEW OF SYSTEMS:    Review of Systems  Constitutional: Positive for chills, diaphoresis, malaise/fatigue and weight loss. Negative for fever.  HENT: Negative for sore throat.   Eyes: Negative for blurred vision, double vision and pain.  Respiratory: Positive for cough and shortness of breath. Negative for hemoptysis and wheezing.   Cardiovascular: Negative for chest pain, palpitations, orthopnea and leg swelling.  Gastrointestinal: Negative for abdominal pain, constipation, diarrhea, heartburn, nausea and vomiting.  Genitourinary: Negative for dysuria and hematuria.  Musculoskeletal: Negative for back pain and joint pain.  Skin: Negative for rash.  Neurological: Negative for sensory change, speech change, focal weakness and headaches.  Endo/Heme/Allergies: Does not bruise/bleed easily.  Psychiatric/Behavioral: Negative for depression. The patient is not nervous/anxious.     DRUG ALLERGIES:   Allergies  Allergen Reactions  . Aspirin     Upset stomach. Gi bleed    VITALS:  Blood pressure 106/67, pulse (!) 0, temperature 100 F (37.8 C), temperature source Axillary, resp. rate (!) 32, height _0  (1.676 m), weight 52.6 kg (115 lb 15.4 oz), SpO2 96 %.  PHYSICAL EXAMINATION:   Physical Exam  GENERAL:  67 y.o.-year-old patient lying in the bed, critically ill on BiPAP EYES: Pupils equal, round, reactive to light and accommodation.  HEENT: Head atraumatic, normocephalic.   NECK:  Supple, no jugular venous distention. No thyroid enlargement, no tenderness.  LUNGS: Decreased air entry on the right side.  No wheezing. CARDIOVASCULAR: S1, S2 normal. No murmurs, rubs, or gallops.  ABDOMEN: Soft, nontender, nondistended.  Bowel sounds present. No organomegaly or mass.  EXTREMITIES: No cyanosis, clubbing or edema b/l.    NEUROLOGIC: Not following instructions PSYCHIATRIC: The patient is lethargic SKIN: No obvious rash, lesion, or ulcer.  LABORATORY PANEL:   CBC Recent Labs  Lab 09/30/17 1235  WBC 27.1*  HGB 9.8*  HCT 30.7*  PLT 122*   ------------------------------------------------------------------------------------------------------------------ Chemistries  Recent Labs  Lab 09/30/17 1235  NA 147*  K 3.1*  CL 106  CO2 30  GLUCOSE 181*  BUN 22*  CREATININE 0.50*  CALCIUM 8.1*  MG 1.9  AST 28  ALT 19  ALKPHOS 126  BILITOT 0.6   ------------------------------------------------------------------------------------------------------------------  Cardiac Enzymes Recent Labs  Lab 09/17/2017 2316  TROPONINI 0.15*   ------------------------------------------------------------------------------------------------------------------  RADIOLOGY:  Dg Chest Port 1 View  Result Date: 09/29/2017 CLINICAL DATA:  67 year old male with cough. Necrotizing pneumonia +/- necrotic tumor. EXAM: PORTABLE CHEST 1 VIEW COMPARISON:  Chest CT and radiographs 09/29/2017 FINDINGS: Portable AP upright view at 0154 hours. The lung bases are not entirely included. Multilobar consolidation redemonstrated in the right lung, worst along the minor fissure. Areas of right upper lobe and middle lobe cavitation were better demonstrated by CT. No superimposed pneumothorax. Patchy peribronchial opacity has increased in the left mid lung. Stable visible mediastinal contour. Visualized tracheal air column is within normal limits. IMPRESSION: 1. Radiographic progression of left lung bronchopneumonia since 09/05/2017. 2. Multilobar necrotizing right lung pneumonia not significantly changed. 3. Lung bases not entirely included. Electronically Signed   By: Genevie Ann M.D.   On: 09/29/2017 02:50     ASSESSMENT AND PLAN:   Jusiah Aguayo  is a 67 y.o. male with  a known history of left facial Bell's palsy, hypertension, ongoing smoking and alcohol use presents to hospital secondary to worsening weakness, difficulty breathing going on for several weeks now.  *Acute on chronic hypoxic and hypercapnic respiratory failure Continue BiPAP support DNR Discussed with Dr. Marvel Plan in the ICU. No improvement in spite of aggressive care.  Palliative care on board.  Likely have to move towards comfort measures.  *Right-sided extensive pneumonia with cavitations likely due to aspiration.  Sepsis PAO On IV antibiotics.  Nebulizers as needed. IV fluids.  *Weight loss.  CT scan of the chest does not show any clear mass.  It is unclear with his extensive pneumonia and the cavitary lesion.  He will need a repeat CT scan in 3 months. Patient does smoke and is at high risk for lung cancer.  *Elevated troponin likely demand ischemia.   Echocardiogram with no wall motion abnormalities  * DVT prophylaxis- lovenox  * Alcohol abuse and withdrawals.  On CIWA protocol.   Patient is critically ill with high risk for deterioration/death.  Poor health condition even prior to admission.  All the records are reviewed and case discussed with Care Management/Social Worker Management plans discussed with the patient, family and they are in agreement.  CODE STATUS: FULL CODE  DVT Prophylaxis: SCDs  TOTAL TIME TAKING CARE OF THIS PATIENT: 20 minutes.   Leia Alf Terryl Niziolek M.D on 09/30/2017 at 2:21 PM  Between 7am to 6pm - Pager - [phone]  After 6pm go to [email or web address] - password [email or web address]  Office  [phone]  CC: Primary care physician; Patient, No Pcp Per  Note: This dictation was prepared with Dragon dictation along with smaller phrase technology. Any transcriptional errors that result from this process are unintentional.

## 2017-09-30 NOTE — Progress Notes (Signed)
Nutrition Follow-up  DOCUMENTATION CODES:   Severe malnutrition in context of chronic illness  INTERVENTION:  No appropriate interventions at this time as patient is NPO on BiPAP.  Focus is on patient's comfort so it was decided to not pursue tube feeding at this time. Will continue to monitor discussions regarding goals of care.  NUTRITION DIAGNOSIS:   Severe Malnutrition related to chronic illness(EtOH abuse) as evidenced by severe fat depletion, severe muscle depletion.  Ongoing.  GOAL:   Patient will meet greater than or equal to 90% of their needs  Not met at this time.  MONITOR:   PO intake, Supplement acceptance, Diet advancement, Labs, Weight trends, Skin, I & O's  REASON FOR ASSESSMENT:   Malnutrition Screening Tool    ASSESSMENT:   67 year old male with PMHx of left facial Bell's palsy, HTN, daily alcohol use who presents with weakness and difficulty breathing admitted with right-sided PNA with cavitations likely due to aspiration.   -Patient transferred to ICU on 3/27 due to worsening respiratory status. He was placed on BiPAP and made NPO. Patient was also made DNR. -TB being ruled out.  Patient remains on BiPAP minimally responsive. In setting of severe malnutrition and inability to eat at this time, tube feeds were considered but plan is to not initiate tube feeds at this time. The recommendation is to focus on the patient's comfort at this time and transition to comfort care. PMT is meeting with family today.  Medications reviewed and include: Solu-Medrol 40 mg Q12hrs IV, Lopressor, Unasyn, azithromycin, Precedex gtt, D5-1/2NS (w/ adult MVI, thiamine 298 mg, folic acid 1 mg) not currently infusing due to lack of access, D5-1/2 NS at 65 mL/hr (stopped per chart), vancomycin.  Labs reviewed: Sodium 147, Potassium 3.1, BUN 22, Creatinine 0.5.  I/O: 750 mL UOP yesterday (0.6 mL/kg/hr)  Weight trend: 52.6 kg on 3/27; weight from 3/25 appears to be stated  weight so weight from 3/27 most likely true weight  Diet Order:  Aspiration precautions Fall precautions  EDUCATION NEEDS:   Not appropriate for education at this time  Skin:  Skin Assessment: Reviewed RN Assessment(ecchymosis to arm)  Last BM:  09/28/2017 - medium type 7  Height:   Ht Readings from Last 1 Encounters:  09/29/17 5' 6" (1.676 m)    Weight:   Wt Readings from Last 1 Encounters:  09/29/17 115 lb 15.4 oz (52.6 kg)    Ideal Body Weight:  59.1 kg  BMI:  Body mass index is 18.72 kg/m.  Estimated Nutritional Needs:   Kcal:  1615-1880 (MSJ x 1.2-1.4)  Protein:  85-100 grams (1.4-1.6 grams/kg)  Fluid:  1.8-2.1 L/day (30-35 mL/kg)  Willey Blade, MS, RD, LDN Office: [phone] Pager: [phone] After Hours/Weekend Pager: [phone]

## 2017-09-30 NOTE — Consult Note (Signed)
Consultation Note Date: 09/30/2017   Patient Name: Kyle Bates  DOB: [birth year]  MRN: [id number]  Age / Sex: 67 y.o., male  PCP: Patient, No Pcp Per Referring Physician: Hillary Bow, MD  Reason for Consultation: Establishing goals of care  HPI/Patient Profile: 67 y.o. male  with past medical history of left facial Bell's palsy, HTN, nicotine and alcohol abuse admitted on 09/08/2017 with sepsis s/t severe cavitary necrotizing pneumonia (r/o TB). Continues with critical illness without improvement.    Clinical Assessment and Goals of Care: I met today with Kyle Bates oldest son, Kyle Bates, as well as multiple other family members. I later met with son, Kyle Bates, alone but passed on the same information. They all have good understanding that Kyle Bates continues to be critically ill and is not improving with aggressive interventions. Everyone agrees with DNR and accepts that he will likely die from this illness. They all have the same concern that he has been "ill" for months but they could not convince him to seek medical help and they all feel like maybe there was something more they could have done. I encouraged them to seek peace that they encouraged him and that he has lived his life the way that he wanted too. Reviewed current assessment of worsening with all family.  I also spoke with Kyle Bates who agrees this is not going to improve. We agreed and I discussed with the 2 sons plan for transition to comfort care tomorrow unless there is a drastic improvement. They agree and understand that this it the most appropriate next step for their father. All questions/concerns addressed. Emotional support provided.   Primary Decision Maker NEXT OF KIN 2 sons    SUMMARY OF RECOMMENDATIONS   - Transition to comfort care Friday if no significant improvement  Code Status/Advance Care Planning:  DNR   Symptom  Management:   Agitation: Precedex began per PCCM.   Pain/SOB: Morphine 2 mg IV every 4 hours prn.   Will need scheduled Robinul prior to d/c BiPAP and likely to need morphine infusion as well.   Palliative Prophylaxis:   Aspiration, Bowel Regimen, Delirium Protocol and Frequent Pain Assessment   Psycho-social/Spiritual:   Desire for further Chaplaincy support:yes  Additional Recommendations: Caregiving  Support/Resources, Compassionate Wean Education and Grief/Bereavement Support  Prognosis:   Hours - Days  Discharge Planning: Anticipated Hospital Death      Primary Diagnoses: Present on Admission: . Pneumonia   I have reviewed the medical record, interviewed the patient and family, and examined the patient. The following aspects are pertinent.  Past Medical History:  Diagnosis Date  . Bell's palsy    left face  . Hypertension    Social History   Socioeconomic History  . Marital status: Single    Spouse name: Not on file  . Number of children: Not on file  . Years of education: Not on file  . Highest education level: Not on file  Occupational History  . Not on file  Social Needs  . Emergency planning/management officer  strain: Not on file  . Food insecurity:    Worry: Not on file    Inability: Not on file  . Transportation needs:    Medical: Not on file    Non-medical: Not on file  Tobacco Use  . Smoking status: Current Every Day Smoker    Packs/day: 1.00    Types: Cigarettes  . Smokeless tobacco: Never Used  Substance and Sexual Activity  . Alcohol use: Yes    Alcohol/week: 1.2 oz    Types: 2 Cans of beer per week    Comment: 3 cans per day  . Drug use: Never  . Sexual activity: Not on file  Lifestyle  . Physical activity:    Days per week: Not on file    Minutes per session: Not on file  . Stress: Not on file  Relationships  . Social connections:    Talks on phone: Not on file    Gets together: Not on file    Attends religious service: Not on file     Active member of club or organization: Not on file    Attends meetings of clubs or organizations: Not on file    Relationship status: Not on file  Other Topics Concern  . Not on file  Social History Narrative   Independent at baseline, lives at home by himself.   Family History  Problem Relation Age of Onset  . Dementia Mother    Scheduled Meds: . budesonide (PULMICORT) nebulizer solution  0.25 mg Nebulization BID  . chlorhexidine  15 mL Mouth Rinse BID  . enoxaparin (LOVENOX) injection  40 mg Subcutaneous Q24H  . ipratropium-albuterol  3 mL Nebulization Q6H  . mouth rinse  15 mL Mouth Rinse q12n4p  . methylPREDNISolone (SOLU-MEDROL) injection  40 mg Intravenous Q12H  . metoprolol tartrate  2.5 mg Intravenous Q6H  . nicotine  21 mg Transdermal Daily   Continuous Infusions: . ampicillin-sulbactam (UNASYN) IV 3 g (09/30/17 1200)  . azithromycin Stopped (09/29/17 1650)  . dexmedetomidine (PRECEDEX) IV infusion 0.6 mcg/kg/hr (09/30/17 1200)  . dextrose 5 % and 0.45% NaCl 1,000 mL with multivitamins adult (MVI -12) 10 mL, thiamine (B-1) 409 mg, folic acid 1 mg infusion Stopped (09/30/17 0152)  . dextrose 5 % and 0.45% NaCl Stopped (09/30/17 1042)  . vancomycin Stopped (09/30/17 1042)   PRN Meds:.LORazepam, morphine injection, ondansetron (ZOFRAN) IV Allergies  Allergen Reactions  . Aspirin     Upset stomach. Gi bleed   Review of Systems  Unable to perform ROS: Acuity of condition    Physical Exam  Constitutional: He appears toxic. He appears ill. He is not intubated.  Cardiovascular: Tachycardia present.  Pulmonary/Chest: Tachypnea noted. He is not intubated. He is in respiratory distress. He has decreased breath sounds. He has rhonchi. He has rales.  Abdominal: Soft. Normal appearance.  Neurological:  Minimally responsive. Does not follow commands. He did open eyes once (not to command) and seemed to nod head yes once briefly but no other responses noted.   Nursing note and  vitals reviewed.   Vital Signs: BP 106/67   Pulse (!) 0   Temp 100 F (37.8 C) (Axillary)   Resp (!) 32   Ht _0  (1.676 m)   Wt 52.6 kg (115 lb 15.4 oz)   SpO2 96%   BMI 18.72 kg/m  Pain Scale: CPOT POSS *See Group Information*: 2-Acceptable,Slightly drowsy, easily aroused Pain Score: Asleep   SpO2: SpO2: 96 % O2 Device:SpO2: 96 % O2 Flow Rate: .  O2 Flow Rate (L/min): 4 L/min  IO: Intake/output summary:   Intake/Output Summary (Last 24 hours) at 09/30/2017 1201 Last data filed at 09/30/2017 0300 Gross per 24 hour  Intake 1830.84 ml  Output 750 ml  Net 1080.84 ml    LBM: Last BM Date: 09/28/17 Baseline Weight: Weight: 61.2 kg (135 lb) Most recent weight: Weight: 52.6 kg (115 lb 15.4 oz)     Palliative Assessment/Data:    Time Total: 75  min Greater than 50%  of this time was spent counseling and coordinating care related to the above assessment and plan.  Signed by: Vinie Sill, NP Palliative Medicine Team Pager # [phone] (M-F 8a-5p) Team Phone # [phone] (Nights/Weekends)

## 2017-09-30 NOTE — Progress Notes (Signed)
SLP Cancellation Note  Patient Details Name: Kyle Bates MRN: [id number] DOB: 06/08/1951   Cancelled treatment:       Reason Eval/Treat Not Completed: Medical issues which prohibited therapy;Patient not medically ready(chart reviewed). Pt remains on BiPAP at this time. ST will continue to monitor pt's progress. Recommend oral care.    Orinda Kenner, MS, CCC-SLP Janaya Broy 09/30/2017, 4:08 PM

## 2017-09-30 NOTE — Progress Notes (Addendum)
Pharmacy Antibiotic Note  Kyle Bates is a 67 y.o. male admitted on 09/22/2017 with pneumonia.  Pharmacy has been consulted for vancomycin dosing.  Plan: Patient received vanc 1g IV x 1  Will continue vanc 1g IV q12h w/ 6 hour stack dose. Will draw vanc trough 03/28 @ 2000 prior to 4th dose.  Ke 0.0705 T1/2 12 hrs Goal trough 15 - 20 mcg/mL  03/28 @ 2200 VT 38 mcg/mL supratherapeutic, drawn appropriately, will hold off on vanc doses for now. Will draw a random level w/ am labs. Scr trending down 0.67 >> 0.56 >> 0.50, UOP 1.4L/24 hours. Doses given at appropriate times, not sure why trough is supratherapeutic. Will get the random level and reassess vanc therapy, MRSA PCR negative; however, since patient has a necrotizing pneumonia, will not suggest to d/c vanc at this time, until Cx's finalize.  MRSA PCR ordered  Height: _0  (167.6 cm) Weight: 115 lb 15.4 oz (52.6 kg) IBW/kg (Calculated) : 63.8  Temp (24hrs), Avg:96.5 F (35.8 C), Min:96.5 F (35.8 C), Max:96.5 F (35.8 C)  Recent Labs  Lab 09/09/2017 1152 09/05/2017 1410 09/07/2017 1730 09/09/2017 2316 09/28/17 0108 09/28/17 0742 09/29/17 1058 09/30/17 1235 09/30/17 2203  WBC 22.1*  --   --  18.5*  --  24.6* 25.5* 27.1*  --   CREATININE 1.22  --   --   --   --  0.67 0.56* 0.50*  --   LATICACIDVEN  --  3.2* 3.7*  --  2.2*  --  2.9*  --   --   VANCOTROUGH  --   --   --   --   --   --   --   --  38*    Estimated Creatinine Clearance: 68.5 mL/min (A) (by C-G formula based on SCr of 0.5 mg/dL (L)).    Allergies  Allergen Reactions  . Aspirin     Upset stomach. Gi bleed    Thank you for allowing pharmacy to be a part of this patient's care.  Tobie Lords, PharmD, BCPS Clinical Pharmacist 09/30/2017

## 2017-09-30 NOTE — Progress Notes (Signed)
Central tele called to report patient had a 8 beat run VT nonsustained. Informed NP and that he had a K earlier of 3.1. It was not treated. NP will order recheck of K to see what needs to be ordered. Pharmacy called about Lucianne Lei trough, spoke to lab earlier about it not being drawn and that patient is not a line draw. Pharmacy will call lab as well to see if it can be drawn.

## 2017-09-30 NOTE — Progress Notes (Signed)
Date: 09/29/2017    HPI:  Patient is a 67 year old male who presented to the ED by EMS with progressive shortness over the last month with increasing progressed over the last few days.  He is lost approximately 30 pounds unintentionally over the last few months.  He has a history of ongoing alcohol and tobacco abuse, with current use of about a pack of cigarettes per day and drink about 3-5 cans of beer every night.  He does not have any previous history other than Bell's palsy or hypertension, there are no previous encounters in our system.  Review of lab studies shows mildly elevated troponin initially of 1.32, since decreased to 0.15. Venous lactic acid was elevated at 3.7, which has since come down to 2.2, the patient also had leukocytosis of 18.5, subsequently increased to 24.6, left shift and toxic granulation, influenza negative. Above seems to suggest sepsis, complicated by NSTEMI.  Significant Event: 03/25-Pt admitted to [facility] unit per HPI above 03/25-CT Chest>>Dense airspace consolidation with cavitation/necrosis is identified within the posterior right upper lobe. There is also extensive airspace consolidation throughout the right lower lobe. Elsewhere in the right lung there are scattered ground glass and solid areas of nodular consolidation. In the acute setting findings are most consistent with multifocal infection with necrotizing pneumonia and pulmonary abscess formation. Alternatively, findings may reflect extensive necrotic tumor. Careful clinical correlation is advised and follow-up imaging is recommended to ensure resolution and to rule out underlying malignancy. Cavitary, solid, and ground-glass nodules scattered within the left lung. Also favored to represent inflammatory/infectious process. Metastatic disease less favored but not excluded. Enlarged mediastinal lymph nodes are identified which are nonspecific in the setting of infection/inflammation. Aortic Atherosclerosis  (ICD10-I70.0) and Emphysema (ICD10-J43.9). Multi vessel coronary artery calcifications noted. 03/27-Pt transferred to stepdown unit due to worsening respiratory failure  03/27-Started on BIPAP; changed to DNR  Physical Examination:   VS: BP (!) 162/83   Pulse 99   Temp 100 F (37.8 C) (Axillary)   Resp 15   Ht _0  (1.676 m)   Wt 115 lb 15.4 oz (52.6 kg)   SpO2 100%   BMI 18.72 kg/m   General Appearance: acutely ill appearing male on Bipap   Neuro: obtunded, not following commands, right pupil reactive, left pupil non reactive  HEENT: supple, mild JVD  Pulmonary: diffuse rhonchi throughout, right > left, tachypnic on Bipap  Cardiovascular: sinus tach, no M/R/G  Abdomen: +BS x4, soft, non distended  Skin: intact no rashes or lesions  Extremities: cachetic, trace bilateral edema   LABORATORY PANEL:   CBC Recent Labs  Lab 09/29/17 1058  WBC 25.5*  HGB 10.5*  HCT 32.5*  PLT 157    Chemistries  Recent Labs  Lab 09/28/17 0742 09/28/17 1022 09/29/17 1058  NA 143  --  147*  K 3.1*  --  4.2  CL 105  --  107  CO2 27  --  25  GLUCOSE 57*  --  108*  BUN 34*  --  23*  CREATININE 0.67  --  0.56*  CALCIUM 7.5*  --  8.4*  MG  --  2.4  --   AST 42*  --   --   ALT 25  --   --   ALKPHOS 135*  --   --   BILITOT 0.6  --   --     Cardiac Enzymes Recent Labs  Lab 09/11/2017 2316  TROPONINI 0.15*    RADIOLOGY:  Dg Chest Port 1  View  Result Date: 09/29/2017 CLINICAL DATA:  67 year old male with cough. Necrotizing pneumonia +/- necrotic tumor. EXAM: PORTABLE CHEST 1 VIEW COMPARISON:  Chest CT and radiographs 09/25/2017 FINDINGS: Portable AP upright view at 0154 hours. The lung bases are not entirely included. Multilobar consolidation redemonstrated in the right lung, worst along the minor fissure. Areas of right upper lobe and middle lobe cavitation were better demonstrated by CT. No superimposed pneumothorax. Patchy peribronchial opacity has increased in the left mid lung.  Stable visible mediastinal contour. Visualized tracheal air column is within normal limits. IMPRESSION: 1. Radiographic progression of left lung bronchopneumonia since 09/23/2017. 2. Multilobar necrotizing right lung pneumonia not significantly changed. 3. Lung bases not entirely included. Electronically Signed   By: Genevie Ann M.D.   On: 09/29/2017 02:50     Assessment and Plan: Severe cavitary necrotizing pneumonia causing sepsis -Continue unasyn, vancomycin, and azithromycin  --TB quantiferon results pending to R/O TB, unable to induce sputum collection --Scheduled bronchodilator therapy but unable to be done because of respiratory status --IV and nebulized steroids  --Patient is now DNR/DNI  Acute Respiratory Failure -hypoxic & hypercarbic Secondary to bilateral pneumonia- R > L Emphysema, seen on CT chest --on  Duoneb, solumedrol --continue with ABX as above --will try to wen Oxygen & BIPAP  Demand ischemia --Cardiology consulted appreciate input  --Continuous telemetry monitoring  --Continue current cardiac medications   Anemia without obvious acute blood loss  --Lovenox for VTE prophylaxis  --Trend CBC  --Monitor for s/sx of bleeding --Transfuse for hgb <7  Severe Malnutrition --Dietitian consulted appreciate input  --Unable to tolerate nutrition because of MS and BIPAP --may need to insert FT for nutrition if continues on current status  Nicotine Abuse --Will need smoking cessation counseling once mentation improves  ETOH Abuse  --CIWA protocol , avoid over sedation --Continue thiamine, folic acid, and mvi, IVF  --ETOH abuse cessation counseling once mentation improves   -Updated: family will be updated upon arrival  Disp: Palliative medicine on consult; ?? Compassionate care  I have dedicated a total of 40 minutes in critical care time minus all appropriate exclusions.  [place] Pager [phone] (please enter 7 digits) PCCM  Consult Pager [phone] (please enter 7 digits)

## 2017-09-30 NOTE — Progress Notes (Signed)
SUBJECTIVE: Currently on BiPAP, resting, alseep   Vitals:   09/30/17 0235 09/30/17 0300 09/30/17 0700 09/30/17 0800  BP:  111/65 135/65 (!) 162/83  Pulse: 88 93 (!) 112 99  Resp: (!) 22 (!) 27 (!) 41 15  Temp:      TempSrc:      SpO2: 94% 95% 100% 100%  Weight:      Height:        Intake/Output Summary (Last 24 hours) at 09/30/2017 0839 Last data filed at 09/30/2017 0300 Gross per 24 hour  Intake 2030.84 ml  Output 750 ml  Net 1280.84 ml    LABS: Basic Metabolic Panel: Recent Labs    09/28/17 0742 09/28/17 1022 09/29/17 1058  NA 143  --  147*  K 3.1*  --  4.2  CL 105  --  107  CO2 27  --  25  GLUCOSE 57*  --  108*  BUN 34*  --  23*  CREATININE 0.67  --  0.56*  CALCIUM 7.5*  --  8.4*  MG  --  2.4  --   PHOS  --   --  4.7*   Liver Function Tests: Recent Labs    10/01/2017 1152 09/28/17 0742  AST 41 42*  ALT 19 25  ALKPHOS 167* 135*  BILITOT 0.6 0.6  PROT 6.4* 5.2*  ALBUMIN 1.6* 1.3*   No results for input(s): LIPASE, AMYLASE in the last 72 hours. CBC: Recent Labs    09/28/17 0742 09/29/17 1058  WBC 24.6* 25.5*  NEUTROABS 23.2* 22.6*  HGB 9.1* 10.5*  HCT 26.9* 32.5*  MCV 89.4 92.7  PLT 261 157   Cardiac Enzymes: Recent Labs    09/13/2017 1152 09/14/2017 1730 09/07/2017 2316 09/28/17 0742  CKTOTAL  --   --   --  27*  TROPONINI 0.07* 1.32* 0.15*  --    BNP: Invalid input(s): POCBNP D-Dimer: No results for input(s): DDIMER in the last 72 hours. Hemoglobin A1C: No results for input(s): HGBA1C in the last 72 hours. Fasting Lipid Panel: No results for input(s): CHOL, HDL, LDLCALC, TRIG, CHOLHDL, LDLDIRECT in the last 72 hours. Thyroid Function Tests: Recent Labs    09/08/2017 1730  TSH 0.434   Anemia Panel: No results for input(s): VITAMINB12, FOLATE, FERRITIN, TIBC, IRON, RETICCTPCT in the last 72 hours.   PHYSICAL EXAM General: Cachetic, frail HEENT:  Normocephalic and atramatic Neck:  No JVD.  Lungs: mild wheezing bilerally Heart: HRRR  . Normal S1 and S2 without gallops or murmurs.  Abdomen: Bowel sounds are positive, abdomen soft and non-tender  Msk:  Back normal, normal gait. Normal strength and tone for age. Extremities: Cachetic, No clubbing, cyanosis or edema.   Neuro: Asleep, not responsive at this time   TELEMETRY: NSR 93bpm  ASSESSMENT AND PLAN: Pneumonia and elevated troponin:  Echo results: Moderate pulmonary hypertension, Normal LVEF, mild diastolicdysfunction and normal wall motion, with mild MR.  Continues to require Bi-PAP respiratory support. Pulmonary hypertension likely due to lung disease and troponin likely due to demand ischemia. No further cardiac work up at this time. Will continue to monitor cardiac status and consider outpatient CCTA once respiratory status is stable and pneumonia is treated.     Active Problems:   Pneumonia   Protein-calorie malnutrition, severe    Kyle Bathe, NP-C 09/30/2017 8:39 AM Cell: [phone]

## 2017-09-30 NOTE — Progress Notes (Signed)
Unable to infuse banana bag at this time as patient is requiring precedex gtt to keep calm and comfortable, IV team is consulted to start another IV.  Will infuse when another access becomes available.

## 2017-10-01 ENCOUNTER — Inpatient Hospital Stay: Payer: No Typology Code available for payment source

## 2017-10-01 LAB — BASIC METABOLIC PANEL
Anion gap: 7 (ref 5–15)
BUN: 26 mg/dL — AB (ref 6–20)
CO2: 31 mmol/L (ref 22–32)
CREATININE: 0.6 mg/dL — AB (ref 0.61–1.24)
Calcium: 8 mg/dL — ABNORMAL LOW (ref 8.9–10.3)
Chloride: 109 mmol/L (ref 101–111)
GFR calc Af Amer: >60 mL/min (ref >60)
GLUCOSE: 170 mg/dL — AB (ref 65–99)
Potassium: 4.1 mmol/L (ref 3.5–5.1)
SODIUM: 147 mmol/L — AB (ref 135–145)

## 2017-10-01 LAB — CBC WITH DIFFERENTIAL/PLATELET
BASOS ABS: 0 10*3/uL (ref 0–0.1)
Basophils Relative: 0 %
EOS ABS: 0 10*3/uL (ref 0–0.7)
EOS PCT: 0 %
HCT: 24.5 % — ABNORMAL LOW (ref 40.0–52.0)
Hemoglobin: 7.9 g/dL — ABNORMAL LOW (ref 13.0–18.0)
LYMPHS PCT: 6 %
Lymphs Abs: 0.9 10*3/uL — ABNORMAL LOW (ref 1.0–3.6)
MCH: 29.4 pg (ref 26.0–34.0)
MCHC: 32.5 g/dL (ref 32.0–36.0)
MCV: 90.6 fL (ref 80.0–100.0)
MONO ABS: 0.3 10*3/uL (ref 0.2–1.0)
Monocytes Relative: 2 %
Neutro Abs: 14.1 10*3/uL — ABNORMAL HIGH (ref 1.4–6.5)
Neutrophils Relative %: 92 %
PLATELETS: 125 10*3/uL — AB (ref 150–440)
RBC: 2.7 MIL/uL — AB (ref 4.40–5.90)
RDW: 13.3 % (ref 11.5–14.5)
WBC: 15.4 10*3/uL — AB (ref 3.8–10.6)

## 2017-10-01 LAB — PHOSPHORUS: Phosphorus: 3.1 mg/dL (ref 2.5–4.6)

## 2017-10-01 LAB — VANCOMYCIN, RANDOM: VANCOMYCIN RM: 16

## 2017-10-01 LAB — MAGNESIUM: MAGNESIUM: 2 mg/dL (ref 1.7–2.4)

## 2017-10-01 MED ORDER — BIOTENE DRY MOUTH MT LIQD: 15.0000 mL | OROMUCOSAL | Status: DC | PRN

## 2017-10-01 MED ORDER — GLYCOPYRROLATE 0.2 MG/ML IJ SOLN
0.2000 mg | INTRAMUSCULAR | Status: DC
  Administered 2017-10-01 – 2017-10-02 (×8): 0.2 mg via INTRAVENOUS
  Filled 2017-10-01 (×8): qty 1

## 2017-10-01 MED ORDER — LORAZEPAM 2 MG/ML IJ SOLN
1.0000 mg | INTRAMUSCULAR | Status: DC | PRN
  Administered 2017-10-01 – 2017-10-02 (×4): 1 mg via INTRAVENOUS
  Filled 2017-10-01 (×4): qty 1

## 2017-10-01 MED ORDER — POLYVINYL ALCOHOL 1.4 % OP SOLN
1.0000 [drp] | Freq: Four times a day (QID) | OPHTHALMIC | Status: DC | PRN
  Filled 2017-10-01: qty 15

## 2017-10-01 MED ORDER — SODIUM CHLORIDE 0.9 % IV SOLN
INTRAVENOUS | Status: DC
  Administered 2017-10-01: 10 mL/h via INTRAVENOUS

## 2017-10-01 MED ORDER — MORPHINE 100MG IN NS 100ML (1MG/ML) PREMIX INFUSION
2.0000 mg/h | INTRAVENOUS | Status: DC
  Administered 2017-10-01: 2 mg/h via INTRAVENOUS
  Administered 2017-10-02 (×2): 4 mg/h via INTRAVENOUS
  Filled 2017-10-01 (×3): qty 100

## 2017-10-01 MED ORDER — MORPHINE BOLUS VIA INFUSION
2.0000 mg | INTRAVENOUS | Status: DC | PRN
  Administered 2017-10-01: 4 mg via INTRAVENOUS
  Administered 2017-10-01 – 2017-10-02 (×2): 2 mg via INTRAVENOUS
  Administered 2017-10-02 (×4): 4 mg via INTRAVENOUS
  Filled 2017-10-01: qty 4

## 2017-10-01 NOTE — Progress Notes (Signed)
Potassium being replaced was 2.8 on repeat draw. Continue on IV ABX. MVI bag complete, back on D5 1/2 NS at 65. Precedex now at 0.5. Has been hypotensive and bradycardic on and off this shift. Scheduled Metoprolol held for parameters. No indications of pain or discomfort. Continue to monitor.

## 2017-10-01 NOTE — Progress Notes (Signed)
Kyle Bates at [place] NAME: Kyle Bates    MR#:  [id number]  DATE OF BIRTH:  Jun 16, 1951  SUBJECTIVE:  CHIEF COMPLAINT:   Chief Complaint  Patient presents with  . Shortness of Breath   On BiPAP.  Precedex drip started due to agitation. Looks critically ill.  REVIEW OF SYSTEMS:    Review of Systems  Constitutional: Positive for chills, diaphoresis, malaise/fatigue and weight loss. Negative for fever.  HENT: Negative for sore throat.   Eyes: Negative for blurred vision, double vision and pain.  Respiratory: Positive for cough and shortness of breath. Negative for hemoptysis and wheezing.   Cardiovascular: Negative for chest pain, palpitations, orthopnea and leg swelling.  Gastrointestinal: Negative for abdominal pain, constipation, diarrhea, heartburn, nausea and vomiting.  Genitourinary: Negative for dysuria and hematuria.  Musculoskeletal: Negative for back pain and joint pain.  Skin: Negative for rash.  Neurological: Negative for sensory change, speech change, focal weakness and headaches.  Endo/Heme/Allergies: Does not bruise/bleed easily.  Psychiatric/Behavioral: Negative for depression. The patient is not nervous/anxious.     DRUG ALLERGIES:   Allergies  Allergen Reactions  . Aspirin     Upset stomach. Gi bleed    VITALS:  Blood pressure (!) 102/58, pulse 77, temperature (!) 97 F (36.1 C), temperature source Axillary, resp. rate (!) 38, height _0  (1.676 m), weight 54.4 kg (119 lb 14.9 oz), SpO2 96 %.  PHYSICAL EXAMINATION:   Physical Exam  GENERAL:  67 y.o.-year-old patient lying in the bed, critically ill on BiPAP EYES: Pupils equal, round, reactive to light and accommodation.  HEENT: Head atraumatic, normocephalic.   NECK:  Supple, no jugular venous distention. No thyroid enlargement, no tenderness.  LUNGS: Decreased air entry on the right side.  No wheezing. CARDIOVASCULAR: S1, S2 normal. No murmurs, rubs, or  gallops.  ABDOMEN: Soft, nontender, nondistended. Bowel sounds present. No organomegaly or mass.  EXTREMITIES: No cyanosis, clubbing or edema b/l.    NEUROLOGIC: Not following instructions PSYCHIATRIC: The patient is lethargic SKIN: No obvious rash, lesion, or ulcer.  LABORATORY PANEL:   CBC Recent Labs  Lab 10/01/17 0716  WBC 15.4*  HGB 7.9*  HCT 24.5*  PLT 125*   ------------------------------------------------------------------------------------------------------------------ Chemistries  Recent Labs  Lab 09/30/17 1235  10/01/17 0716  NA 147*  --  147*  K 3.1*   < > 4.1  CL 106  --  109  CO2 30  --  31  GLUCOSE 181*  --  170*  BUN 22*  --  26*  CREATININE 0.50*  --  0.60*  CALCIUM 8.1*  --  8.0*  MG 1.9  --  2.0  AST 28  --   --   ALT 19  --   --   ALKPHOS 126  --   --   BILITOT 0.6  --   --    < > = values in this interval not displayed.   ------------------------------------------------------------------------------------------------------------------  Cardiac Enzymes Recent Labs  Lab 09/18/2017 2316  TROPONINI 0.15*   ------------------------------------------------------------------------------------------------------------------  RADIOLOGY:  Dg Chest Port 1 View  Result Date: 10/01/2017 CLINICAL DATA:  Follow-up pneumonia EXAM: PORTABLE CHEST 1 VIEW COMPARISON:  09/29/2017.  09/29/2017. FINDINGS: Left lung shows some persistent patchy mild infiltrate with volume loss at base. Much more extensive infiltrate throughout the right lung shows a pattern of some worsening over the last 4 days, with more volume loss. Somewhat unusual lucency at the right upper lateral pleural space could represent  a localize pneumothorax. Alternatively, this could be a peripheral bleb, which is actually favored based on the previous CT. IMPRESSION: Persistent extensive infiltrate throughout the right lung with areas of patchy involvement in the left lung. Over the last several days,  there is slight worsening with more volume loss. Peripheral lucency in the right upper chest that I favor represents a peripheral bleb rather than a tiny localized pneumothorax. Electronically Signed   By: Kyle Bates M.D.   On: 10/01/2017 07:39     ASSESSMENT AND PLAN:   Kyle Bates  is a 67 y.o. male with a known history of left facial Bell's palsy, hypertension, ongoing smoking and alcohol use presents to hospital secondary to worsening weakness, difficulty breathing going on for several weeks now.  *Acute on chronic hypoxic and hypercapnic respiratory failure Continue BiPAP support DNR No improvement in spite of aggressive care.  Palliative care on board.   Transition to comfort measures later today.  *Right-sided extensive pneumonia with cavitations likely due to aspiration.  Sepsis PAO Nebulizers as needed. IV antibiotics  *Weight loss.  CT scan of the chest does not show any clear mass.  It is unclear with his extensive pneumonia and the cavitary lesion.  He will need a repeat CT scan in 3 months. Patient does smoke and is at high risk for lung cancer.  *Elevated troponin likely demand ischemia.   Echocardiogram with no wall motion abnormalities  * DVT prophylaxis- lovenox  * Alcohol abuse and withdrawals.  On CIWA protocol.  All the records are reviewed and case discussed with Care Management/Social Worker Management plans discussed with the patient, family and they are in agreement.  CODE STATUS: DNR  DVT Prophylaxis: SCDs  TOTAL TIME TAKING CARE OF THIS PATIENT: 30 minutes.   Kyle Bates M.D on 10/01/2017 at 11:05 AM  Between 7am to 6pm - Pager - [phone]  After 6pm go to [email or web address] - password [email or web address] [facility]  Office  [phone]  CC: Primary care physician; Patient, No Pcp Per  Note: This dictation was prepared with Dragon dictation along with smaller phrase technology. Any transcriptional errors that result from  this process are unintentional.

## 2017-10-01 NOTE — Progress Notes (Addendum)
Daily Progress Note   Patient Name: Kyle Bates       Date: 10/01/2017 DOB: 1950/09/23  Age: 67 y.o. MRN#: [id number] Attending Physician: Hillary Bow, MD Primary Care Physician: Patient, No Pcp Per Admit Date: 09/21/2017  Reason for Consultation/Follow-up: Establishing goals of care  Subjective: Kyle Bates continues on BiPAP. He is not improving but does nod his head yes or no to some questions.   Length of Stay: 4  Current Medications: Scheduled Meds:  . budesonide (PULMICORT) nebulizer solution  0.25 mg Nebulization BID  . chlorhexidine  15 mL Mouth Rinse BID  . enoxaparin (LOVENOX) injection  40 mg Subcutaneous Q24H  . ipratropium-albuterol  3 mL Nebulization Q6H  . mouth rinse  15 mL Mouth Rinse q12n4p  . methylPREDNISolone (SOLU-MEDROL) injection  40 mg Intravenous Q12H  . metoprolol tartrate  2.5 mg Intravenous Q6H  . nicotine  21 mg Transdermal Daily  . sodium chloride flush  10-40 mL Intracatheter Q12H    Continuous Infusions: . ampicillin-sulbactam (UNASYN) IV Stopped (10/01/17 0511)  . azithromycin Stopped (09/30/17 1653)  . dexmedetomidine (PRECEDEX) IV infusion 0.4 mcg/kg/hr (10/01/17 0655)  . dextrose 5 % and 0.45% NaCl 1,000 mL with multivitamins adult (MVI -12) 10 mL, thiamine (B-1) 814 mg, folic acid 1 mg infusion Stopped (10/01/17 4818)  . dextrose 5 % and 0.45% NaCl 65 mL/hr at 10/01/17 0621    PRN Meds: LORazepam, morphine injection, ondansetron (ZOFRAN) IV, sodium chloride flush  Physical Exam  Constitutional: He appears toxic. He appears ill.  HENT:  Head: Normocephalic and atraumatic.  Cardiovascular: Normal rate.  Pulmonary/Chest: No accessory muscle usage. No tachypnea. No respiratory distress. He has decreased breath sounds. He has rhonchi.    Abdominal: Soft. Normal appearance.  Neurological:  Minimally responsive  Nursing note and vitals reviewed.           Vital Signs: BP (!) 102/58   Pulse 77   Temp (!) 97 F (36.1 C) (Axillary)   Resp (!) 38   Ht _0  (1.676 m)   Wt 54.4 kg (119 lb 14.9 oz)   SpO2 96%   BMI 19.36 kg/m  SpO2: SpO2: 96 % O2 Device: O2 Device: Bi-PAP O2 Flow Rate: O2 Flow Rate (L/min): 4 L/min  Intake/output summary:   Intake/Output Summary (Last 24 hours) at 10/01/2017  [place] filed at 10/01/2017 8887 Gross per 24 hour  Intake 2727.78 ml  Output 1800 ml  Net 927.78 ml   LBM: Last BM Date: 09/28/17 Baseline Weight: Weight: 61.2 kg (135 lb) Most recent weight: Weight: 54.4 kg (119 lb 14.9 oz)       Palliative Assessment/Data: 20%     Patient Active Problem List   Diagnosis Date Noted  . Respiratory failure ([place])   . Goals of care, counseling/discussion   . Palliative care encounter   . Protein-calorie malnutrition, severe 09/28/2017  . Pneumonia 09/04/2017    Palliative Care Assessment & Plan   HPI: 67 y.o. male  with past medical history of left facial Bell's palsy, HTN, nicotine and alcohol abuse admitted on 09/24/2017 with sepsis s/t severe cavitary necrotizing pneumonia (r/o TB). Continues with critical illness without improvement.    Assessment: Proceeded with plans today to transition to comfort care and d/c BiPAP. I discussed fully with sons, Kyle Bates and Kyle Bates, separately. Discussed with multiple family members what to expect and that prognosis is poor but he could live hours or he could live days - it is hard to know. I discussed plan to initiate morphine infusion prior to d/c BiPAP to ensure comfort.  Late entry: Proceeded with d/c BiPAP with permission of family. He tolerated very well and appears to be comfortable on 2L nasal cannula and morphine infusion. He is able to nod head yes/no to his family most times but unable to open eyes or verbalize. All  questions/concerns addressed with family. All understand he is at EOL. Transfer to floor if he remains stable.   Recommendations/Plan:  Pain/dyspnea: Morphine infusion 4 mg/hr may titrate if needing multiple boluses. Morphine bolus 2-4 mg every 15 min prn.   Ativan prn agitation.   Robinul for secretions.   Goals of Care and Additional Recommendations:  Limitations on Scope of Treatment: Full Comfort Care  Code Status:  DNR  Prognosis:   Hours - Days  Discharge Planning:  Anticipated Hospital Death  Care plan was discussed with Dr. Darvin Neighbours  Thank you for allowing the Palliative Medicine Team to assist in the care of this patient.   Total Time 60 min Prolonged Time Billed  no       Greater than 50%  of this time was spent counseling and coordinating care related to the above assessment and plan.  Vinie Sill, NP Palliative Medicine Team Pager # [phone] (M-F 8a-5p) Team Phone # [phone] (Nights/Weekends)

## 2017-10-01 NOTE — Progress Notes (Signed)
SUBJECTIVE: patient is intubated and unresponsive   Vitals:   10/01/17 0300 10/01/17 0400 10/01/17 0500 10/01/17 0600  BP: 96/63 (!) 92/56 117/61 (!) 88/53  Pulse: 74 73 81 74  Resp: (!) 29 (!) 33 (!) 32 (!) 34  Temp:      TempSrc:      SpO2: 95% 96% 97% 95%  Weight:   119 lb 14.9 oz (54.4 kg)   Height:        Intake/Output Summary (Last 24 hours) at 10/01/2017 0832 Last data filed at 10/01/2017 0355 Gross per 24 hour  Intake 2927.78 ml  Output 1800 ml  Net 1127.78 ml    LABS: Basic Metabolic Panel: Recent Labs    09/30/17 1235 09/30/17 2203 10/01/17 0716  NA 147*  --  147*  K 3.1* 2.8* 4.1  CL 106  --  109  CO2 30  --  31  GLUCOSE 181*  --  170*  BUN 22*  --  26*  CREATININE 0.50*  --  0.60*  CALCIUM 8.1*  --  8.0*  MG 1.9  --  2.0  PHOS 3.9  --  3.1   Liver Function Tests: Recent Labs    09/30/17 1235  AST 28  ALT 19  ALKPHOS 126  BILITOT 0.6  PROT 5.7*  ALBUMIN 1.4*   No results for input(s): LIPASE, AMYLASE in the last 72 hours. CBC: Recent Labs    09/30/17 1235 10/01/17 0716  WBC 27.1* 15.4*  NEUTROABS 25.4* 14.1*  HGB 9.8* 7.9*  HCT 30.7* 24.5*  MCV 92.8 90.6  PLT 122* 125*   Cardiac Enzymes: No results for input(s): CKTOTAL, CKMB, CKMBINDEX, TROPONINI in the last 72 hours. BNP: Invalid input(s): POCBNP D-Dimer: No results for input(s): DDIMER in the last 72 hours. Hemoglobin A1C: No results for input(s): HGBA1C in the last 72 hours. Fasting Lipid Panel: No results for input(s): CHOL, HDL, LDLCALC, TRIG, CHOLHDL, LDLDIRECT in the last 72 hours. Thyroid Function Tests: No results for input(s): TSH, T4TOTAL, T3FREE, THYROIDAB in the last 72 hours.  Invalid input(s): FREET3 Anemia Panel: No results for input(s): VITAMINB12, FOLATE, FERRITIN, TIBC, IRON, RETICCTPCT in the last 72 hours.   PHYSICAL EXAM General: Well developed, well nourished, in no acute distress HEENT:  Normocephalic and atramatic Neck:  No JVD.  Lungs: Clear  bilaterally to auscultation and percussion. Heart: HRRR . Normal S1 and S2 without gallops or murmurs.  Abdomen: Bowel sounds are positive, abdomen soft and non-tender  Msk:  Back normal, normal gait. Normal strength and tone for age. Extremities: No clubbing, cyanosis or edema.   Neuro: Alert and oriented X 3. Psych:  Good affect, responds appropriately  TELEMETRY: sinus rhythm  ASSESSMENT AND PLAN: mildly elevated troponin due to demand ischemia in the setting of pneumonia and respiratory failure. Patient remains intubated advise further workup once patient is stable and may consider outpatient  CTA coronaries once patient is discharged as may have coronary artery disease with mildly elevated troponin.  Active Problems:   Pneumonia   Protein-calorie malnutrition, severe   Respiratory failure ([place])   Goals of care, counseling/discussion   Palliative care encounter    Dionisio David, MD, [facility] 10/01/2017 8:32 AM

## 2017-10-01 NOTE — Progress Notes (Signed)
Palliative NP at bedside with family.  Pt given Ativan, and Bipap mask discontinued at this time.  Pt placed on 2 liters [place].   Morphine gtt continues to infuse at 70m/hr.

## 2017-10-02 LAB — CULTURE, BLOOD (ROUTINE X 2)
CULTURE: NO GROWTH
Culture: NO GROWTH
SPECIAL REQUESTS: ADEQUATE
Special Requests: ADEQUATE

## 2017-10-02 LAB — QUANTIFERON-TB GOLD PLUS (RQFGPL)
QUANTIFERON TB1 AG VALUE: 0.02 [IU]/mL
QUANTIFERON TB2 AG VALUE: 0.02 [IU]/mL
QuantiFERON Mitogen Value: 0.01 IU/mL
QuantiFERON Nil Value: 0.02 IU/mL

## 2017-10-02 LAB — QUANTIFERON-TB GOLD PLUS: QuantiFERON-TB Gold Plus: UNDETERMINED

## 2017-10-02 MED ORDER — VASOPRESSIN 20 UNIT/ML IV SOLN: 0.0300 [IU]/min | INTRAVENOUS | Status: DC

## 2017-10-02 NOTE — Progress Notes (Signed)
Pt care assumed.  NAD noted.  Pt appears comfortable in bed with eyes closed.  Remains comfort care.

## 2017-10-02 NOTE — Progress Notes (Signed)
Patient resting in bed at this time. Continues on morphine drip at 47m/hr continuous. Patient has received morphine bolus x2 and PRN Ativan x2 this shift. Urine output remains adequate. Vitals remain stable and patient is beginning to cool from the knees down. Family not present but has called twice for updates.

## 2017-10-02 NOTE — Progress Notes (Signed)
Pt seen fidgeting in bed, Tachyapenic.  Given bolus of Morphine.

## 2017-10-02 NOTE — Progress Notes (Signed)
[place] [facility] [place] NAME: Wilmore Holsomback    MR#:  [id number]  DATE OF BIRTH:  September 04, 1950  SUBJECTIVE:   Patient is on comfort care  Patient resting  REVIEW OF SYSTEMS:    Unable to obtain      DRUG ALLERGIES:   Allergies  Allergen Reactions  . Aspirin     Upset stomach. Gi bleed    VITALS:  Blood pressure 115/73, pulse (!) 107, temperature (!) 96.5 F (35.8 C), temperature source Axillary, resp. rate 20, height _0  (1.676 m), weight 54.4 kg (119 lb 14.9 oz), SpO2 (!) 89 %.  PHYSICAL EXAMINATION:  Constitutional: Chronically ill-appearing      LABORATORY PANEL:   CBC Recent Labs  Lab 10/01/17 0716  WBC 15.4*  HGB 7.9*  HCT 24.5*  PLT 125*   ------------------------------------------------------------------------------------------------------------------  Chemistries  Recent Labs  Lab 09/30/17 1235  10/01/17 0716  NA 147*  --  147*  K 3.1*   < > 4.1  CL 106  --  109  CO2 30  --  31  GLUCOSE 181*  --  170*  BUN 22*  --  26*  CREATININE 0.50*  --  0.60*  CALCIUM 8.1*  --  8.0*  MG 1.9  --  2.0  AST 28  --   --   ALT 19  --   --   ALKPHOS 126  --   --   BILITOT 0.6  --   --    < > = values in this interval not displayed.   ------------------------------------------------------------------------------------------------------------------  Cardiac Enzymes Recent Labs  Lab 09/11/2017 1152 09/30/2017 1730 09/03/2017 2316  TROPONINI 0.07* 1.32* 0.15*   ------------------------------------------------------------------------------------------------------------------  RADIOLOGY:  Dg Chest Port 1 View  Result Date: 10/01/2017 CLINICAL DATA:  Follow-up pneumonia EXAM: PORTABLE CHEST 1 VIEW COMPARISON:  09/29/2017.  09/19/2017. FINDINGS: Left lung shows some persistent patchy mild infiltrate with volume loss at base. Much more extensive infiltrate throughout the right lung shows a pattern of some worsening  over the last 4 days, with more volume loss. Somewhat unusual lucency at the right upper lateral pleural space could represent a localize pneumothorax. Alternatively, this could be a peripheral bleb, which is actually favored based on the previous CT. IMPRESSION: Persistent extensive infiltrate throughout the right lung with areas of patchy involvement in the left lung. Over the last several days, there is slight worsening with more volume loss. Peripheral lucency in the right upper chest that I favor represents a peripheral bleb rather than a tiny localized pneumothorax. Electronically Signed   By: Nelson Chimes M.D.   On: 10/01/2017 07:39     ASSESSMENT AND PLAN:   67 year old male with hypertension who presented to ER due to generalized weakness  1.  Acute on chronic hypoxic and hypercapnic respiratory failure  2.  Right-sided extensive pneumonia with cavitations  3.  Elevated troponin due to demand ischemia  4.  EtOH abuse  Patient is currently on comfort care       CODE STATUS: DNR  TOTAL TIME TAKING CARE OF THIS PATIENT: 5 minutes.     POSSIBLE D/C ??, DEPENDING ON CLINICAL CONDITION.   Lakeria Starkman M.D on 10/02/2017 at 11:56 AM  Between 7am to 6pm - Pager - [phone] After 6pm go to [email or web address] - password [email or web address] [place] [facility]  Office  [phone]  CC: Primary care physician; Patient, No Pcp Per  Note: This dictation was prepared with Dragon  dictation along with smaller phrase technology. Any transcriptional errors that result from this process are unintentional.

## 2017-10-02 NOTE — Progress Notes (Signed)
Family at bedside.  Pt appears comfortable at this time.

## 2017-10-04 NOTE — Death Summary Note (Signed)
DEATH SUMMARY   Patient Details  Name: Kyle Bates MRN: [id number] DOB: 06-Jul-1951  Admission/Discharge Information   Admit Date:  10-27-2017  Date of Death: Date of Death: 11/02/2017  Time of Death: Time of Death: 0153  Length of Stay: 2022-09-10  Referring Physician: Patient, No Pcp Per   Reason(s) for Hospitalization  Severe cavitary necrotizing pneumonia causing sepsis Acute Respiratory Failure -hypoxic & hypercarbic Secondary to bilateral pneumonia- R > L Emphysema, seen on CT chest Demand ischemia Anemia without obvious acute blood loss  Severe Malnutrition Nicotine Abuse ETOH Abuse   Diagnoses  Preliminary cause of death: Respiratory failure with hypoxia and hypercarbia Secondary Diagnoses (including complications and co-morbidities):  Active Problems:   Pneumonia   Protein-calorie malnutrition, severe   Respiratory failure (HCC)   Goals of care, counseling/discussion   Palliative care encounter Severe cavitary necrotizing pneumonia causing sepsis Acute Respiratory Failure -hypoxic & hypercarbic Secondary to bilateral pneumonia- R > L Emphysema, seen on CT chest Demand ischemia Anemia without obvious acute blood loss  Severe Malnutrition Nicotine Abuse ETOH Abuse Brief Hospital Course (including significant findings, care, treatment, and services provided and events leading to death)  Kyle Bates is a 67 y.o. year old male who was admitted with respiratory distress and found to have extensive cavitary pneumonia, sepsis and severe malnutrition. Despite treatment, patient continued to deteriorate hence palliative care was consulted. Palliative care met with patient's family on 10/01/2017 and they decided to proceed with full comfort measures. Patient was started on a morphine gtte and expired Nov 02, 2017 an 0153. Pertinent Labs and Studies  Significant Diagnostic Studies Dg Chest 2 View  Result Date: 10-27-2017 CLINICAL DATA:  Shortness of breath and hypoxia EXAM: CHEST  - 2 VIEW COMPARISON:  None. FINDINGS: Cardiac shadow is within normal limits. Left lung demonstrates some mild interstitial changes. Diffuse consolidation is noted in the right upper lobe along the minor fissure. Patchy infiltrative changes are identified throughout the right lung. No sizable effusion is noted. No bony abnormality is seen. IMPRESSION: Multifocal infiltrates worse in the right upper lobe. Followup PA and lateral chest X-ray is recommended in 3-4 weeks following trial of antibiotic therapy to ensure resolution and exclude underlying malignancy. Electronically Signed   By: Inez Catalina M.D.   On: 10-27-17 13:20   Ct Chest W Contrast  Result Date: 2017-10-27 CLINICAL DATA:  Cough. EXAM: CT CHEST WITH CONTRAST TECHNIQUE: Multidetector CT imaging of the chest was performed during intravenous contrast administration. CONTRAST:  90m ISOVUE-300 IOPAMIDOL (ISOVUE-300) INJECTION 61% COMPARISON:  None FINDINGS: Cardiovascular: The heart size appears normal. Aortic atherosclerosis identified. Calcification in the LAD, left circumflex and RCA coronary artery noted. No pericardial effusion. Mediastinum/Nodes: The thyroid gland appears normal. Unremarkable appearance of the trachea. The esophagus is normal. Right paratracheal lymph node measures 1.3 cm, image 45/series 2. Lower right paratracheal lymph node measures 1.3 cm, image 62/series 2. Lungs/Pleura: No pleural effusion. Mild to moderate changes of centrilobular emphysema identified. Large, multifocal areas of dense airspace consolidation identified within the right upper lobe and right lower lobe. Central cavitation within the right upper lobe cavitation is identified compatible with necrosis, image 48/series 3. Patchy areas of ground-glass attenuation and airspace consolidation are identified within the remaining portions of the right lung and scattered throughout the left lung. Cavitary nodule within the periphery of the left upper lobe measures 2  cm, image 85/3. Solid non cavitary nodule within the left upper lobe is identified measuring 6 mm, image 32/series 3. Areas of subsegmental atelectasis identified  within the posterior left lower lobe. Upper Abdomen: No acute abnormality. Musculoskeletal: No chest wall abnormality. No acute or significant osseous findings. Spondylosis noted within the thoracic spine. IMPRESSION: 1. Dense airspace consolidation with cavitation/necrosis is identified within the posterior right upper lobe. There is also extensive airspace consolidation throughout the right lower lobe. Elsewhere in the right lung there are scattered ground-glass and solid areas of nodular consolidation. In the acute setting findings are most consistent with multifocal infection with necrotizing pneumonia and pulmonary abscess formation. Alternatively, findings may reflect extensive necrotic tumor. Careful clinical correlation is advised and follow-up imaging is recommended to ensure resolution and to rule out underlying malignancy. 2. Cavitary, solid, and ground-glass nodules scattered within the left lung. Also favored to represent inflammatory/infectious process. Metastatic disease less favored but not excluded. 3. Enlarged mediastinal lymph nodes are identified which are nonspecific in the setting of infection/inflammation. 4. Aortic Atherosclerosis (ICD10-I70.0) and Emphysema (ICD10-J43.9). Multi vessel coronary artery calcifications noted. Electronically Signed   By: Kerby Moors M.D.   On: 09/16/2017 16:25   Dg Chest Port 1 View  Result Date: 10/01/2017 CLINICAL DATA:  Follow-up pneumonia EXAM: PORTABLE CHEST 1 VIEW COMPARISON:  09/29/2017.  09/15/2017. FINDINGS: Left lung shows some persistent patchy mild infiltrate with volume loss at base. Much more extensive infiltrate throughout the right lung shows a pattern of some worsening over the last 4 days, with more volume loss. Somewhat unusual lucency at the right upper lateral pleural space  could represent a localize pneumothorax. Alternatively, this could be a peripheral bleb, which is actually favored based on the previous CT. IMPRESSION: Persistent extensive infiltrate throughout the right lung with areas of patchy involvement in the left lung. Over the last several days, there is slight worsening with more volume loss. Peripheral lucency in the right upper chest that I favor represents a peripheral bleb rather than a tiny localized pneumothorax. Electronically Signed   By: Nelson Chimes M.D.   On: 10/01/2017 07:39   Dg Chest Port 1 View  Result Date: 09/29/2017 CLINICAL DATA:  67 year old male with cough. Necrotizing pneumonia +/- necrotic tumor. EXAM: PORTABLE CHEST 1 VIEW COMPARISON:  Chest CT and radiographs 09/12/2017 FINDINGS: Portable AP upright view at 0154 hours. The lung bases are not entirely included. Multilobar consolidation redemonstrated in the right lung, worst along the minor fissure. Areas of right upper lobe and middle lobe cavitation were better demonstrated by CT. No superimposed pneumothorax. Patchy peribronchial opacity has increased in the left mid lung. Stable visible mediastinal contour. Visualized tracheal air column is within normal limits. IMPRESSION: 1. Radiographic progression of left lung bronchopneumonia since 10/02/2017. 2. Multilobar necrotizing right lung pneumonia not significantly changed. 3. Lung bases not entirely included. Electronically Signed   By: Genevie Ann M.D.   On: 09/29/2017 02:50    Microbiology Recent Results (from the past 240 hour(s))  Culture, blood (routine x 2)     Status: None   Collection Time: 10/01/2017  2:10 PM  Result Value Ref Range Status   Specimen Description BLOOD LEFT FA  Final   Special Requests   Final    BOTTLES DRAWN AEROBIC AND ANAEROBIC Blood Culture adequate volume   Culture   Final    NO GROWTH 5 DAYS Performed at [facility], [address] [place], [place] [address]    Report Status 10/02/2017  FINAL  Final  Culture, blood (routine x 2)     Status: None   Collection Time: 09/19/2017  2:10 PM  Result  Value Ref Range Status   Specimen Description BLOOD RIGHT FA  Final   Special Requests   Final    BOTTLES DRAWN AEROBIC AND ANAEROBIC Blood Culture adequate volume   Culture   Final    NO GROWTH 5 DAYS Performed at [facility], [address] [place], [place] [address]    Report Status 10/02/2017 FINAL  Final  MRSA PCR Screening     Status: None   Collection Time: 09/29/17  4:35 AM  Result Value Ref Range Status   MRSA by PCR NEGATIVE NEGATIVE Final    Comment:        The GeneXpert MRSA Assay (FDA approved for NASAL specimens only), is one component of a comprehensive MRSA colonization surveillance program. It is not intended to diagnose MRSA infection nor to guide or monitor treatment for MRSA infections. Performed at [facility], [address] [place], [place] [address]     Lab Basic Metabolic Panel: Recent Labs  Lab 09/25/2017 1152 09/28/17 0742 09/28/17 1022 09/29/17 1058 09/30/17 1235 09/30/17 2203 10/01/17 0716  NA 138 143  --  147* 147*  --  147*  K 2.8* 3.1*  --  4.2 3.1* 2.8* 4.1  CL 91* 105  --  107 106  --  109  CO2 28 27  --  25 30  --  31  GLUCOSE 111* 57*  --  108* 181*  --  170*  BUN 61* 34*  --  23* 22*  --  26*  CREATININE 1.22 0.67  --  0.56* 0.50*  --  0.60*  CALCIUM 8.1* 7.5*  --  8.4* 8.1*  --  8.0*  MG  --   --  2.4  --  1.9  --  2.0  PHOS  --   --   --  4.7* 3.9  --  3.1   Liver Function Tests: Recent Labs  Lab 09/25/2017 1152 09/28/17 0742 09/30/17 1235  AST 41 42* 28  ALT _0 ALKPHOS 167* 135* 126  BILITOT 0.6 0.6 0.6  PROT 6.4* 5.2* 5.7*  ALBUMIN 1.6* 1.3* 1.4*   No results for input(s): LIPASE, AMYLASE in the last 168 hours. No results for input(s): AMMONIA in the last 168 hours. CBC: Recent Labs  Lab 09/16/2017 1152 09/13/2017 2316 09/28/17 0742 09/29/17 1058 09/30/17 1235 10/01/17 0716   WBC 22.1* 18.5* 24.6* 25.5* 27.1* 15.4*  NEUTROABS 20.8*  --  23.2* 22.6* 25.4* 14.1*  HGB 11.6* 10.0* 9.1* 10.5* 9.8* 7.9*  HCT 35.3* 30.6* 26.9* 32.5* 30.7* 24.5*  MCV 90.7 90.4 89.4 92.7 92.8 90.6  PLT 380 350 261 157 122* 125*   Cardiac Enzymes: Recent Labs  Lab 09/09/2017 1152 09/19/2017 1730 09/04/2017 2316 09/28/17 0742  CKTOTAL  --   --   --  27*  TROPONINI 0.07* 1.32* 0.15*  --    Sepsis Labs: Recent Labs  Lab 09/21/2017 1410 09/25/2017 1730  09/28/17 0108 09/28/17 0742 09/29/17 1058 09/30/17 1235 10/01/17 0716  WBC  --   --    < >  --  24.6* 25.5* 27.1* 15.4*  LATICACIDVEN 3.2* 3.7*  --  2.2*  --  2.9*  --   --    < > = values in this interval not displayed.    Procedures/Operations  none   Davarius Ridener S[facility] ANP-BC Pulmonary and Critical Care Medicine [facility] Pager [phone] or [phone]  NB: This document was prepared using Systems analyst and may include  unintentional dictation errors.   October 30, 2017, 6:39 AM

## 2017-10-04 NOTE — Progress Notes (Signed)
Brother Chrissie Noa was notified and gave funeral home information. [facility] aware as well as NP. Central monitoring aware and sent strips. Family will be in to take personal effects in the AM, clothing and shoes.

## 2017-10-04 NOTE — Progress Notes (Signed)
All VS ceased, patient pronounced at [place] by two RNs.

## 2017-10-04 DEATH — deceased

## 2017-10-07 ENCOUNTER — Telehealth: Payer: Self-pay | Admitting: *Deleted

## 2017-10-07 NOTE — Telephone Encounter (Signed)
[facility] informed death cert is ready for pick up.

## 2017-10-07 NOTE — Telephone Encounter (Signed)
[facility] dropped off a death certificate for the patient. Please call when ready for pick up . The # is [phone].  I place it in the nurse box. Thank you

## 2019-09-12 IMAGING — DX DG CHEST 1V PORT
1 series · 1 of 1 positions shown · non-contrast
Comparison: 09/29/2017.  09/27/2017.

CLINICAL DATA: Follow-up pneumonia

EXAM:
PORTABLE CHEST 1 VIEW

[chest ap]
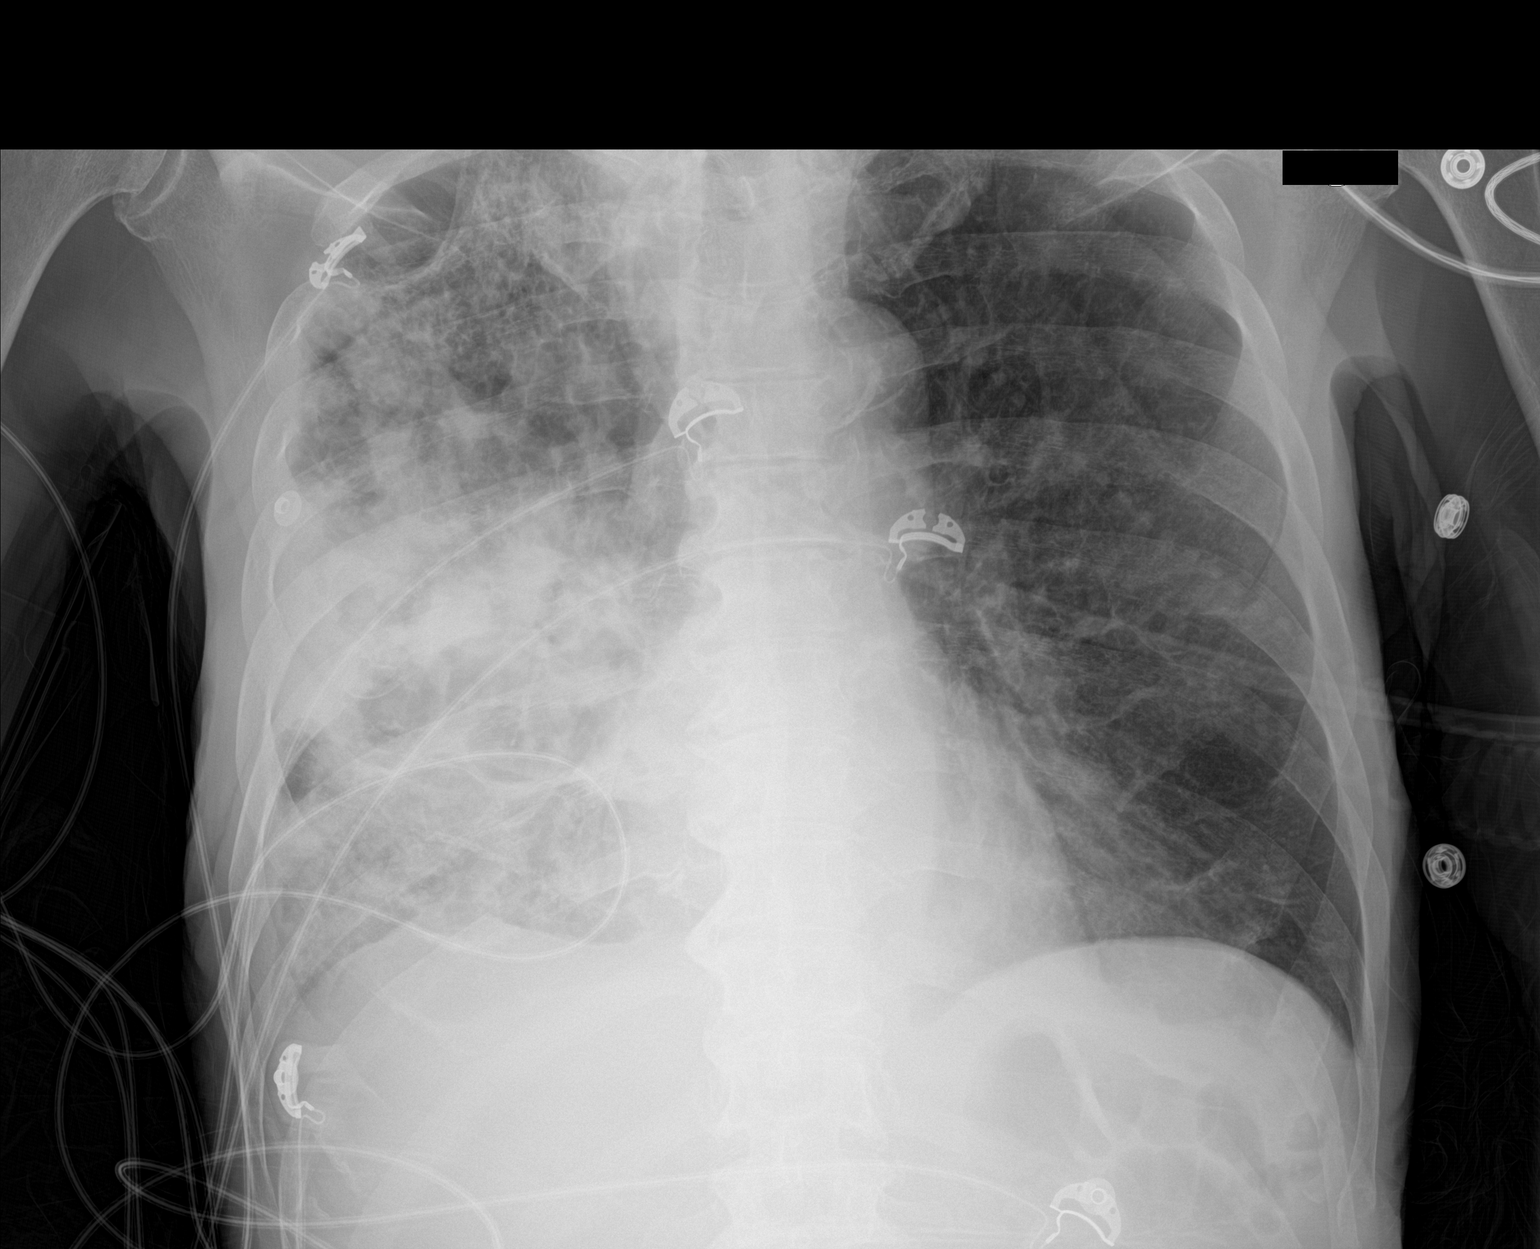

[1 of 1 positions shown; findings below may reference images not displayed]

FINDINGS: Left lung shows some persistent patchy mild infiltrate with volume
loss at base. Much more extensive infiltrate throughout the right
lung shows a pattern of some worsening over the last 4 days, with
more volume loss. Somewhat unusual lucency at the right upper
lateral pleural space could represent a localize pneumothorax.
Alternatively, this could be a peripheral bleb, which is actually
favored based on the previous CT.
IMPRESSION: Persistent extensive infiltrate throughout the right lung with areas
of patchy involvement in the left lung. Over the last several days,
there is slight worsening with more volume loss.

Peripheral lucency in the right upper chest that I favor represents
a peripheral bleb rather than a tiny localized pneumothorax.
